# Patient Record
Sex: Female | Born: 1960 | Race: White | Hispanic: No | Smoking: Never smoker
Health system: Southern US, Community
[De-identification: ages and names within clinical notes are randomized; demographics above are authoritative.]

## PROBLEM LIST (undated history)

## (undated) ENCOUNTER — Emergency Department (HOSPITAL_COMMUNITY): Admission: EM | Payer: Medicare Other | Source: Home / Self Care

## (undated) DIAGNOSIS — R51 Headache: Secondary | ICD-10-CM

## (undated) DIAGNOSIS — F32A Depression, unspecified: Secondary | ICD-10-CM

## (undated) DIAGNOSIS — M797 Fibromyalgia: Secondary | ICD-10-CM

## (undated) DIAGNOSIS — I10 Essential (primary) hypertension: Secondary | ICD-10-CM

## (undated) DIAGNOSIS — G5600 Carpal tunnel syndrome, unspecified upper limb: Secondary | ICD-10-CM

## (undated) DIAGNOSIS — M1990 Unspecified osteoarthritis, unspecified site: Secondary | ICD-10-CM

## (undated) DIAGNOSIS — E119 Type 2 diabetes mellitus without complications: Secondary | ICD-10-CM

## (undated) DIAGNOSIS — F329 Major depressive disorder, single episode, unspecified: Secondary | ICD-10-CM

## (undated) DIAGNOSIS — F419 Anxiety disorder, unspecified: Secondary | ICD-10-CM

## (undated) HISTORY — PX: ANKLE SURGERY: SHX546

## (undated) HISTORY — PX: OTHER SURGICAL HISTORY: SHX169

---

## 2010-06-28 ENCOUNTER — Ambulatory Visit: Payer: Medicare Other | Admitting: Physical Therapy

## 2010-06-29 ENCOUNTER — Ambulatory Visit: Payer: Medicare Other | Attending: Orthopedic Surgery | Admitting: Physical Therapy

## 2010-06-29 DIAGNOSIS — M546 Pain in thoracic spine: Secondary | ICD-10-CM | POA: Insufficient documentation

## 2010-06-29 DIAGNOSIS — M545 Low back pain, unspecified: Secondary | ICD-10-CM | POA: Insufficient documentation

## 2010-06-29 DIAGNOSIS — R5381 Other malaise: Secondary | ICD-10-CM | POA: Insufficient documentation

## 2010-06-29 DIAGNOSIS — IMO0001 Reserved for inherently not codable concepts without codable children: Secondary | ICD-10-CM | POA: Insufficient documentation

## 2010-06-29 DIAGNOSIS — M542 Cervicalgia: Secondary | ICD-10-CM | POA: Insufficient documentation

## 2010-07-06 ENCOUNTER — Ambulatory Visit: Payer: Medicare Other | Admitting: Physical Therapy

## 2010-07-07 ENCOUNTER — Ambulatory Visit: Payer: Medicare Other | Admitting: Physical Therapy

## 2010-07-12 ENCOUNTER — Ambulatory Visit: Payer: Medicare Other | Attending: Orthopedic Surgery | Admitting: Physical Therapy

## 2010-07-12 DIAGNOSIS — M546 Pain in thoracic spine: Secondary | ICD-10-CM | POA: Insufficient documentation

## 2010-07-12 DIAGNOSIS — M545 Low back pain, unspecified: Secondary | ICD-10-CM | POA: Insufficient documentation

## 2010-07-12 DIAGNOSIS — M542 Cervicalgia: Secondary | ICD-10-CM | POA: Insufficient documentation

## 2010-07-12 DIAGNOSIS — R5381 Other malaise: Secondary | ICD-10-CM | POA: Insufficient documentation

## 2010-07-12 DIAGNOSIS — IMO0001 Reserved for inherently not codable concepts without codable children: Secondary | ICD-10-CM | POA: Insufficient documentation

## 2010-07-13 ENCOUNTER — Ambulatory Visit: Payer: Medicare Other | Admitting: Physical Therapy

## 2010-07-19 ENCOUNTER — Ambulatory Visit: Payer: Medicare Other | Admitting: Physical Therapy

## 2010-07-20 ENCOUNTER — Ambulatory Visit: Payer: Medicare Other | Admitting: Physical Therapy

## 2010-07-22 ENCOUNTER — Other Ambulatory Visit: Payer: Self-pay | Admitting: Physical Medicine and Rehabilitation

## 2010-07-22 DIAGNOSIS — M542 Cervicalgia: Secondary | ICD-10-CM

## 2010-07-22 DIAGNOSIS — M545 Low back pain, unspecified: Secondary | ICD-10-CM

## 2010-07-26 ENCOUNTER — Ambulatory Visit: Payer: Medicare Other | Admitting: Physical Therapy

## 2010-07-27 ENCOUNTER — Encounter: Payer: Medicare Other | Admitting: Physical Therapy

## 2010-07-29 ENCOUNTER — Ambulatory Visit
Admission: RE | Admit: 2010-07-29 | Discharge: 2010-07-29 | Disposition: A | Discharge status: Home / Self Care | Payer: Medicare Other | Source: Ambulatory Visit | Attending: Physical Medicine and Rehabilitation | Admitting: Physical Medicine and Rehabilitation

## 2010-07-29 DIAGNOSIS — M545 Low back pain, unspecified: Secondary | ICD-10-CM

## 2010-07-29 DIAGNOSIS — M542 Cervicalgia: Secondary | ICD-10-CM

## 2011-02-14 ENCOUNTER — Ambulatory Visit (HOSPITAL_BASED_OUTPATIENT_CLINIC_OR_DEPARTMENT_OTHER): Admission: RE | Admit: 2011-02-14 | Payer: Medicare Other | Source: Ambulatory Visit | Admitting: Orthopedic Surgery

## 2011-12-01 ENCOUNTER — Encounter (HOSPITAL_COMMUNITY): Payer: Self-pay | Admitting: *Deleted

## 2011-12-01 ENCOUNTER — Emergency Department (HOSPITAL_COMMUNITY)
Admission: EM | Admit: 2011-12-01 | Discharge: 2011-12-01 | Discharge status: Against Medical Advice | Payer: Medicare Other | Attending: Emergency Medicine | Admitting: Emergency Medicine

## 2011-12-01 DIAGNOSIS — R110 Nausea: Secondary | ICD-10-CM | POA: Insufficient documentation

## 2011-12-01 DIAGNOSIS — R197 Diarrhea, unspecified: Secondary | ICD-10-CM | POA: Insufficient documentation

## 2011-12-01 HISTORY — DX: Essential (primary) hypertension: I10

## 2011-12-01 HISTORY — DX: Anxiety disorder, unspecified: F41.9

## 2011-12-01 NOTE — ED Notes (Signed)
Patient relates she began having nausea after taking Effexor at about 5pm yesterday.  Began having diarrhea today at about 9 am.

## 2013-11-11 NOTE — H&P (Deleted)
Cheyenne Welch is an 53 y.o. female.   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  Symptoms unchanged from previous visit.  Wants to proceed with L4-5 TLIF.  Past Medical History  Diagnosis Date  . Cancer     R breast  . Anxiety   . Hypertension   . Aneurysm     vessel behind left eye  . Ischemic colitis   . Personal history of radiation therapy     currently receiving radiation therapy 8/35    Past Surgical History  Procedure Laterality Date  . Brain surgery      aneurysm clipping  . Breast surgery      lumpectomy R breast  . Cholecystectomy    . Appendectomy    . Abdominal hysterectomy      Family History  Problem Relation Age of Onset  . Cancer Mother   . Hypertension Mother   . Hypertension Father   . Diabetes Father   . Cancer Father   . Hypertension Brother    Social History:  reports that she has quit smoking. She does not have any smokeless tobacco history on file. She reports that she does not drink alcohol or use illicit drugs.  Allergies:  Allergies  Allergen Reactions  . Vicodin [Hydrocodone-Acetaminophen] Nausea And Vomiting    No prescriptions prior to admission    No results found for this or any previous visit (from the past 48 hour(s)). No results found.  Review of Systems  Constitutional: Negative.   HENT: Negative.   Eyes: Negative.   Respiratory: Negative.   Cardiovascular: Negative.   Gastrointestinal: Negative.   Genitourinary: Negative.   Musculoskeletal: Positive for back pain.  Skin: Negative.   Neurological: Positive for tingling. Negative for speech change, seizures and loss of consciousness.  Psychiatric/Behavioral: The patient is nervous/anxious.     There were no vitals taken for this visit. Physical Exam  Constitutional: She is oriented to person, place, and time. She appears well-developed.  HENT:  Head:  Normocephalic and atraumatic.  Eyes: EOM are normal. Pupils are equal, round, and reactive to light.  Neck: Normal range of motion.  Cardiovascular: Normal rate and regular rhythm.   Respiratory: Effort normal and breath sounds normal.  GI: Soft. Bowel sounds are normal.  Musculoskeletal: She exhibits tenderness (right ankle ttp.  ).  Neurological: She is alert and oriented to person, place, and time.  Skin: Skin is warm and dry.  Psychiatric: She has a normal mood and affect.     Assessment/Plan L4-5 severe DDD.  Low back pain and right LE radiculopathy Will proceed with L4-5 TLIF as sched.  Surgical procedure along with potential risks and complications discussed in detail.  All questions answered.    Cheyenne Welch M 11/11/2013, 9:08 AM

## 2013-11-12 NOTE — H&P (Signed)
Agree with above Plan on moving forward with surgery as planned

## 2013-11-12 NOTE — Pre-Procedure Instructions (Signed)
Allix Ouderkirk  11/12/2013   Your procedure is scheduled on:  Thursday, November 14, 2013  Report to [facility] Entrance "A" [address] at [facility] AM.  Call this number if you have problems the morning of surgery: [phone]   Remember:   Do not eat food or drink liquids after midnight.   Take these medicines the morning of surgery with A SIP OF WATER: albuterol inhaler, bring with you on the day of surgery, Xanax if needed, Wellbutrin XL, Effexor-XR   STOP taking Aspirin, Goody's, BC's, Aleve (Naproxen), Ibuprofen (Advil or Motrin), Fish Oil, Vitamins, Herbal Supplements or any substance that could thin your blood starting today, November 13, 2013.   Do not wear jewelry, make-up or nail polish.  Do not wear lotions, powders, or perfumes. You may wear deodorant.  Do not shave 48 hours prior to surgery.   Do not bring valuables to the hospital.  [facility] is not responsibe for any belongings or valuables.               Contacts, dentures or bridgework may not be worn into surgery.  Leave suitcase in the car. After surgery it may be brought to your room.  For patients admitted to the hospital, discharge time is determined by your treatment team.               Patients discharged the day of surgery will not be allowed to drive home.    Special Instructions:[place] - Preparing for Surgery  Before surgery, you can play an important role.  Because skin is not sterile, your skin needs to be as free of germs as possible.  You can reduce the number of germs on you skin by washing with CHG (chlorahexidine gluconate) soap before surgery.  CHG is an antiseptic cleaner which kills germs and bonds with the skin to continue killing germs even after washing.  Please DO NOT use if you have an allergy to CHG or antibacterial soaps.  If your skin becomes reddened/irritated stop using the CHG and inform your nurse when you arrive at Short Stay.  Do not shave (including legs  and underarms) for at least 48 hours prior to the first CHG shower.  You may shave your face.  Please follow these instructions carefully:   1.  Shower with CHG Soap the night before surgery and the                                morning of Surgery.  2.  If you choose to wash your hair, wash your hair first as usual with your  normal shampoo.  3.  After you shampoo, rinse your hair and body thoroughly to remove the Shampoo.  4.  Use CHG as you would any other liquid soap.  You can apply chg directly to the skin and wash gently with scrungie or a clean washcloth.  5.  Apply the CHG Soap to your body ONLY FROM THE NECK DOWN.   Do not use on open wounds or open sores.  Avoid contact with your eyes, ears, mouth and genitals (private parts).  Wash genitals (private parts)  with your normal soap.  6.  Wash thoroughly, paying special attention to the area where your surgery will be performed.  7.  Thoroughly rinse your body with warm water from the neck down.  8.  DO NOT shower/wash with your normal  soap after using and rinsing off the CHG Soap.  9.  Pat yourself dry with a clean towel.            10.  Wear clean pajamas.            11.  Place clean sheets on your bed the night of your first shower and do not sleep with pets.  Day of Surgery  Do not apply any lotions/deoderants the morning of surgery.  Please wear clean clothes to the hospital/surgery center.      Please read over the following fact sheets that you were given: Pain Booklet, Coughing and Deep Breathing, Blood Transfusion Information, MRSA Information and Surgical Site Infection Prevention

## 2013-11-12 NOTE — Progress Notes (Signed)
No orders, Dr. Rolena Infante office called by Danae Chen regarding this and PAT appt 11/13/13 at 1200.

## 2013-11-13 ENCOUNTER — Ambulatory Visit (HOSPITAL_COMMUNITY)
Admission: RE | Admit: 2013-11-13 | Discharge: 2013-11-13 | Disposition: A | Discharge status: Home / Self Care | Payer: Medicare Other | Source: Ambulatory Visit | Attending: Anesthesiology | Admitting: Anesthesiology

## 2013-11-13 ENCOUNTER — Encounter (HOSPITAL_COMMUNITY)
Admission: RE | Admit: 2013-11-13 | Discharge: 2013-11-13 | Disposition: A | Discharge status: Home / Self Care | Payer: Medicare Other | Source: Ambulatory Visit | Attending: Orthopedic Surgery | Admitting: Orthopedic Surgery

## 2013-11-13 ENCOUNTER — Encounter (HOSPITAL_COMMUNITY): Payer: Self-pay

## 2013-11-13 DIAGNOSIS — M48061 Spinal stenosis, lumbar region without neurogenic claudication: Secondary | ICD-10-CM | POA: Diagnosis not present

## 2013-11-13 DIAGNOSIS — M549 Dorsalgia, unspecified: Secondary | ICD-10-CM | POA: Insufficient documentation

## 2013-11-13 DIAGNOSIS — C50919 Malignant neoplasm of unspecified site of unspecified female breast: Secondary | ICD-10-CM | POA: Diagnosis not present

## 2013-11-13 DIAGNOSIS — E119 Type 2 diabetes mellitus without complications: Secondary | ICD-10-CM | POA: Diagnosis not present

## 2013-11-13 DIAGNOSIS — F411 Generalized anxiety disorder: Secondary | ICD-10-CM | POA: Diagnosis not present

## 2013-11-13 HISTORY — DX: Type 2 diabetes mellitus without complications: E11.9

## 2013-11-13 HISTORY — DX: Fibromyalgia: M79.7

## 2013-11-13 HISTORY — DX: Depression, unspecified: F32.A

## 2013-11-13 HISTORY — DX: Major depressive disorder, single episode, unspecified: F32.9

## 2013-11-13 HISTORY — DX: Headache: R51

## 2013-11-13 HISTORY — DX: Unspecified osteoarthritis, unspecified site: M19.90

## 2013-11-13 HISTORY — DX: Carpal tunnel syndrome, unspecified upper limb: G56.00

## 2013-11-13 LAB — CBC
HCT: 39.6 % (ref 36.0–46.0)
Hemoglobin: 13.2 g/dL (ref 12.0–15.0)
MCH: 30.6 pg (ref 26.0–34.0)
MCHC: 33.3 g/dL (ref 30.0–36.0)
MCV: 91.9 fL (ref 78.0–100.0)
PLATELETS: 220 10*3/uL (ref 150–400)
RBC: 4.31 MIL/uL (ref 3.87–5.11)
RDW: 13.6 % (ref 11.5–15.5)
WBC: 6.3 10*3/uL (ref 4.0–10.5)

## 2013-11-13 LAB — SURGICAL PCR SCREEN
MRSA, PCR: NEGATIVE
STAPHYLOCOCCUS AUREUS: NEGATIVE

## 2013-11-13 LAB — TYPE AND SCREEN
ABO/RH(D): A POS
Antibody Screen: NEGATIVE

## 2013-11-13 LAB — BASIC METABOLIC PANEL
Anion gap: 13 (ref 5–15)
BUN: 14 mg/dL (ref 6–23)
CALCIUM: 9.1 mg/dL (ref 8.4–10.5)
CO2: 26 mEq/L (ref 19–32)
Chloride: 100 mEq/L (ref 96–112)
Creatinine, Ser: 0.64 mg/dL (ref 0.50–1.10)
Glucose, Bld: 223 mg/dL — ABNORMAL HIGH (ref 70–99)
POTASSIUM: 4.1 meq/L (ref 3.7–5.3)
SODIUM: 139 meq/L (ref 137–147)

## 2013-11-13 LAB — ABO/RH: ABO/RH(D): A POS

## 2013-11-13 MED ORDER — ACETAMINOPHEN 10 MG/ML IV SOLN
1000.0000 mg | Freq: Four times a day (QID) | INTRAVENOUS | Status: DC
  Filled 2013-11-13: qty 100

## 2013-11-13 MED ORDER — DEXAMETHASONE SODIUM PHOSPHATE 4 MG/ML IJ SOLN
4.0000 mg | Freq: Once | INTRAMUSCULAR | Status: AC
  Administered 2013-11-14: 4 mg via INTRAVENOUS
  Filled 2013-11-13: qty 1

## 2013-11-13 NOTE — Progress Notes (Signed)
11/13/13 1252  OBSTRUCTIVE SLEEP APNEA  Have you ever been diagnosed with sleep apnea through a sleep study? No  Do you snore loudly (loud enough to be heard through closed doors)?  1  Do you often feel tired, fatigued, or sleepy during the daytime? 1  Has anyone observed you stop breathing during your sleep? 0  Do you have, or are you being treated for high blood pressure? 1  BMI more than 35 kg/m2? 1  Age over 54 years old? 1  Neck circumference greater than 40 cm/16 inches? 1  Gender: 0  Obstructive Sleep Apnea Score 6  Score 4 or greater  Results sent to PCP

## 2013-11-13 NOTE — Progress Notes (Signed)
PCP is Dr Reesa Chew Denies seeing a cardiologist. Denies having a recent CXR or EKG Denies having a stress test, card cath, or echo.

## 2013-11-14 ENCOUNTER — Encounter (HOSPITAL_COMMUNITY): Payer: Self-pay | Admitting: *Deleted

## 2013-11-14 ENCOUNTER — Encounter (HOSPITAL_COMMUNITY): Payer: Medicare Other | Admitting: Anesthesiology

## 2013-11-14 ENCOUNTER — Inpatient Hospital Stay (HOSPITAL_COMMUNITY): Payer: Medicare Other | Admitting: Anesthesiology

## 2013-11-14 ENCOUNTER — Inpatient Hospital Stay (HOSPITAL_COMMUNITY): Payer: Medicare Other

## 2013-11-14 ENCOUNTER — Inpatient Hospital Stay (HOSPITAL_COMMUNITY)
Admission: RE | Admit: 2013-11-14 | Discharge: 2013-11-19 | DRG: 460 | Disposition: A | Discharge status: Skilled Nursing Facility | Payer: Medicare Other | Source: Ambulatory Visit | Attending: Orthopedic Surgery | Admitting: Orthopedic Surgery

## 2013-11-14 ENCOUNTER — Encounter (HOSPITAL_COMMUNITY)
Admission: RE | Disposition: A | Discharge status: Skilled Nursing Facility | Payer: Medicare Other | Source: Ambulatory Visit | Attending: Orthopedic Surgery

## 2013-11-14 DIAGNOSIS — F3289 Other specified depressive episodes: Secondary | ICD-10-CM | POA: Diagnosis present

## 2013-11-14 DIAGNOSIS — M48061 Spinal stenosis, lumbar region without neurogenic claudication: Principal | ICD-10-CM | POA: Diagnosis present

## 2013-11-14 DIAGNOSIS — F329 Major depressive disorder, single episode, unspecified: Secondary | ICD-10-CM | POA: Diagnosis present

## 2013-11-14 DIAGNOSIS — M549 Dorsalgia, unspecified: Secondary | ICD-10-CM | POA: Diagnosis present

## 2013-11-14 DIAGNOSIS — IMO0001 Reserved for inherently not codable concepts without codable children: Secondary | ICD-10-CM | POA: Diagnosis present

## 2013-11-14 DIAGNOSIS — Z923 Personal history of irradiation: Secondary | ICD-10-CM

## 2013-11-14 DIAGNOSIS — F411 Generalized anxiety disorder: Secondary | ICD-10-CM | POA: Diagnosis present

## 2013-11-14 DIAGNOSIS — C50919 Malignant neoplasm of unspecified site of unspecified female breast: Secondary | ICD-10-CM | POA: Diagnosis present

## 2013-11-14 DIAGNOSIS — E119 Type 2 diabetes mellitus without complications: Secondary | ICD-10-CM | POA: Diagnosis present

## 2013-11-14 DIAGNOSIS — I10 Essential (primary) hypertension: Secondary | ICD-10-CM | POA: Diagnosis present

## 2013-11-14 DIAGNOSIS — Z87891 Personal history of nicotine dependence: Secondary | ICD-10-CM

## 2013-11-14 DIAGNOSIS — M129 Arthropathy, unspecified: Secondary | ICD-10-CM | POA: Diagnosis present

## 2013-11-14 HISTORY — PX: TRANSFORAMINAL LUMBAR INTERBODY FUSION (TLIF) WITH PEDICLE SCREW FIXATION 4 LEVEL: SHX6144

## 2013-11-14 LAB — GLUCOSE, CAPILLARY
GLUCOSE-CAPILLARY: 165 mg/dL — AB (ref 70–99)
GLUCOSE-CAPILLARY: 165 mg/dL — AB (ref 70–99)
Glucose-Capillary: 184 mg/dL — ABNORMAL HIGH (ref 70–99)
Glucose-Capillary: 222 mg/dL — ABNORMAL HIGH (ref 70–99)

## 2013-11-14 SURGERY — POSTERIOR LUMBAR FUSION 1 LEVEL
Anesthesia: General

## 2013-11-14 MED ORDER — ACETAMINOPHEN 10 MG/ML IV SOLN
1000.0000 mg | Freq: Four times a day (QID) | INTRAVENOUS | Status: AC
  Administered 2013-11-14 – 2013-11-15 (×4): 1000 mg via INTRAVENOUS
  Filled 2013-11-14 (×5): qty 100

## 2013-11-14 MED ORDER — FENTANYL 10 MCG/ML IV SOLN
INTRAVENOUS | Status: DC
  Administered 2013-11-14: 50 ug via INTRAVENOUS
  Administered 2013-11-14: 18:00:00 via INTRAVENOUS
  Administered 2013-11-15: 293.9 ug via INTRAVENOUS
  Administered 2013-11-15: 114 ug via INTRAVENOUS
  Administered 2013-11-15: 05:00:00 via INTRAVENOUS
  Filled 2013-11-14 (×3): qty 50

## 2013-11-14 MED ORDER — CEFAZOLIN SODIUM-DEXTROSE 2-3 GM-% IV SOLR
INTRAVENOUS | Status: DC | PRN
  Administered 2013-11-14: 2 g via INTRAVENOUS

## 2013-11-14 MED ORDER — ONDANSETRON HCL 4 MG/2ML IJ SOLN
4.0000 mg | INTRAMUSCULAR | Status: DC | PRN
  Administered 2013-11-14: 4 mg via INTRAVENOUS

## 2013-11-14 MED ORDER — OXYCODONE HCL 5 MG PO TABS: 5.0000 mg | ORAL_TABLET | Freq: Once | ORAL | Status: DC | PRN

## 2013-11-14 MED ORDER — BUPROPION HCL ER (XL) 300 MG PO TB24
300.0000 mg | ORAL_TABLET | Freq: Every day | ORAL | Status: DC
  Administered 2013-11-16 – 2013-11-19 (×4): 300 mg via ORAL
  Filled 2013-11-14 (×5): qty 1

## 2013-11-14 MED ORDER — MIDAZOLAM HCL 5 MG/5ML IJ SOLN
INTRAMUSCULAR | Status: DC | PRN
Start: 2013-11-14 — End: 2013-11-14
  Administered 2013-11-14: 2 mg via INTRAVENOUS

## 2013-11-14 MED ORDER — ALPRAZOLAM 0.5 MG PO TABS
0.5000 mg | ORAL_TABLET | Freq: Two times a day (BID) | ORAL | Status: DC | PRN
  Administered 2013-11-15 – 2013-11-19 (×6): 0.5 mg via ORAL
  Filled 2013-11-14 (×6): qty 1

## 2013-11-14 MED ORDER — THROMBIN 20000 UNITS EX KIT: PACK | CUTANEOUS | Status: DC | PRN

## 2013-11-14 MED ORDER — METFORMIN HCL ER 500 MG PO TB24
1000.0000 mg | ORAL_TABLET | Freq: Two times a day (BID) | ORAL | Status: DC
  Administered 2013-11-15 – 2013-11-19 (×9): 1000 mg via ORAL
  Filled 2013-11-14 (×11): qty 2

## 2013-11-14 MED ORDER — LACTATED RINGERS IV SOLN
Freq: Once | INTRAVENOUS | Status: AC
  Administered 2013-11-14: 10:00:00 via INTRAVENOUS

## 2013-11-14 MED ORDER — PROPOFOL INFUSION 10 MG/ML OPTIME
INTRAVENOUS | Status: DC | PRN
  Administered 2013-11-14: 16:00:00 via INTRAVENOUS
  Administered 2013-11-14: 50 ug/kg/min via INTRAVENOUS

## 2013-11-14 MED ORDER — LACTATED RINGERS IV SOLN
INTRAVENOUS | Status: DC
  Administered 2013-11-14: 23:00:00 via INTRAVENOUS

## 2013-11-14 MED ORDER — INSULIN ASPART 100 UNIT/ML ~~LOC~~ SOLN
0.0000 [IU] | SUBCUTANEOUS | Status: DC
  Administered 2013-11-14 – 2013-11-15 (×3): 5 [IU] via SUBCUTANEOUS
  Administered 2013-11-15: 3 [IU] via SUBCUTANEOUS
  Administered 2013-11-15: 2 [IU] via SUBCUTANEOUS
  Administered 2013-11-16: 3 [IU] via SUBCUTANEOUS
  Administered 2013-11-16: 2 [IU] via SUBCUTANEOUS
  Administered 2013-11-16: 3 [IU] via SUBCUTANEOUS
  Administered 2013-11-16: 5 [IU] via SUBCUTANEOUS
  Administered 2013-11-17 (×5): 2 [IU] via SUBCUTANEOUS
  Administered 2013-11-18: 3 [IU] via SUBCUTANEOUS
  Administered 2013-11-18: 2 [IU] via SUBCUTANEOUS

## 2013-11-14 MED ORDER — ONDANSETRON HCL 4 MG/2ML IJ SOLN: 4.0000 mg | Freq: Four times a day (QID) | INTRAMUSCULAR | Status: DC | PRN

## 2013-11-14 MED ORDER — LIDOCAINE HCL (PF) 1 % IJ SOLN
INTRAMUSCULAR | Status: AC
  Filled 2013-11-14: qty 30

## 2013-11-14 MED ORDER — NITROGLYCERIN 0.4 MG/SPRAY TL SOLN
Status: AC
  Filled 2013-11-14: qty 4.9

## 2013-11-14 MED ORDER — PROPOFOL 10 MG/ML IV BOLUS
INTRAVENOUS | Status: DC | PRN
  Administered 2013-11-14: 17 mg via INTRAVENOUS

## 2013-11-14 MED ORDER — HYDROMORPHONE HCL PF 1 MG/ML IJ SOLN
INTRAMUSCULAR | Status: AC
  Administered 2013-11-14: 0.5 mg via INTRAVENOUS
  Filled 2013-11-14: qty 1

## 2013-11-14 MED ORDER — HYDROMORPHONE HCL PF 1 MG/ML IJ SOLN
0.2500 mg | INTRAMUSCULAR | Status: DC | PRN
  Administered 2013-11-14 (×2): 0.5 mg via INTRAVENOUS

## 2013-11-14 MED ORDER — ARTIFICIAL TEARS OP OINT
TOPICAL_OINTMENT | OPHTHALMIC | Status: DC | PRN
  Administered 2013-11-14: 1 via OPHTHALMIC

## 2013-11-14 MED ORDER — PHENOL 1.4 % MT LIQD: 1.0000 | OROMUCOSAL | Status: DC | PRN

## 2013-11-14 MED ORDER — PROPOFOL 10 MG/ML IV BOLUS
INTRAVENOUS | Status: AC
  Filled 2013-11-14: qty 20

## 2013-11-14 MED ORDER — MIDAZOLAM HCL 2 MG/2ML IJ SOLN
INTRAMUSCULAR | Status: AC
  Filled 2013-11-14: qty 2

## 2013-11-14 MED ORDER — CEFAZOLIN SODIUM 1-5 GM-% IV SOLN
1.0000 g | Freq: Three times a day (TID) | INTRAVENOUS | Status: AC
  Administered 2013-11-14 – 2013-11-15 (×2): 1 g via INTRAVENOUS
  Filled 2013-11-14 (×3): qty 50

## 2013-11-14 MED ORDER — OXYCODONE HCL 5 MG/5ML PO SOLN: 5.0000 mg | Freq: Once | ORAL | Status: DC | PRN

## 2013-11-14 MED ORDER — THROMBIN 20000 UNITS EX SOLR
CUTANEOUS | Status: DC | PRN
  Administered 2013-11-14: 14:00:00 via TOPICAL

## 2013-11-14 MED ORDER — SODIUM CHLORIDE 0.9 % IJ SOLN: 9.0000 mL | INTRAMUSCULAR | Status: DC | PRN

## 2013-11-14 MED ORDER — LACTATED RINGERS IV SOLN
INTRAVENOUS | Status: DC | PRN
  Administered 2013-11-14 (×3): via INTRAVENOUS

## 2013-11-14 MED ORDER — PNEUMOCOCCAL VAC POLYVALENT 25 MCG/0.5ML IJ INJ
0.5000 mL | INJECTION | INTRAMUSCULAR | Status: DC
  Filled 2013-11-14: qty 0.5

## 2013-11-14 MED ORDER — DIPHENHYDRAMINE HCL 50 MG/ML IJ SOLN: 12.5000 mg | Freq: Four times a day (QID) | INTRAMUSCULAR | Status: DC | PRN

## 2013-11-14 MED ORDER — FENTANYL CITRATE 0.05 MG/ML IJ SOLN
INTRAMUSCULAR | Status: AC
  Filled 2013-11-14: qty 5

## 2013-11-14 MED ORDER — ACETAMINOPHEN 10 MG/ML IV SOLN
INTRAVENOUS | Status: DC | PRN
  Administered 2013-11-14: 1000 mg via INTRAVENOUS

## 2013-11-14 MED ORDER — DIPHENHYDRAMINE HCL 12.5 MG/5ML PO ELIX: 12.5000 mg | ORAL_SOLUTION | Freq: Four times a day (QID) | ORAL | Status: DC | PRN

## 2013-11-14 MED ORDER — SODIUM CHLORIDE 0.9 % IJ SOLN
3.0000 mL | INTRAMUSCULAR | Status: DC | PRN
Start: 2013-11-14 — End: 2013-11-19

## 2013-11-14 MED ORDER — ONDANSETRON HCL 4 MG/2ML IJ SOLN
4.0000 mg | Freq: Four times a day (QID) | INTRAMUSCULAR | Status: DC | PRN
Start: 2013-11-14 — End: 2013-11-15
  Filled 2013-11-14: qty 2

## 2013-11-14 MED ORDER — SODIUM CHLORIDE 0.9 % IV SOLN: 250.0000 mL | INTRAVENOUS | Status: DC

## 2013-11-14 MED ORDER — NITROGLYCERIN 0.2 MG/ML ON CALL CATH LAB
INTRAVENOUS | Status: AC
  Filled 2013-11-14: qty 1

## 2013-11-14 MED ORDER — MENTHOL 3 MG MT LOZG: 1.0000 | LOZENGE | OROMUCOSAL | Status: DC | PRN

## 2013-11-14 MED ORDER — THROMBIN 20000 UNITS EX SOLR
CUTANEOUS | Status: AC
  Filled 2013-11-14: qty 20000

## 2013-11-14 MED ORDER — LIDOCAINE HCL (CARDIAC) 20 MG/ML IV SOLN
INTRAVENOUS | Status: DC | PRN
  Administered 2013-11-14: 75 mg via INTRAVENOUS

## 2013-11-14 MED ORDER — METHOCARBAMOL 1000 MG/10ML IJ SOLN
500.0000 mg | Freq: Four times a day (QID) | INTRAVENOUS | Status: DC | PRN
  Filled 2013-11-14: qty 5

## 2013-11-14 MED ORDER — FENTANYL CITRATE 0.05 MG/ML IJ SOLN
INTRAMUSCULAR | Status: DC | PRN
  Administered 2013-11-14 (×3): 50 ug via INTRAVENOUS
  Administered 2013-11-14: 100 ug via INTRAVENOUS
  Administered 2013-11-14: 50 ug via INTRAVENOUS
  Administered 2013-11-14: 100 ug via INTRAVENOUS
  Administered 2013-11-14 (×2): 50 ug via INTRAVENOUS

## 2013-11-14 MED ORDER — LOSARTAN POTASSIUM 50 MG PO TABS
50.0000 mg | ORAL_TABLET | Freq: Every day | ORAL | Status: DC
  Administered 2013-11-16 – 2013-11-19 (×4): 50 mg via ORAL
  Filled 2013-11-14 (×5): qty 1

## 2013-11-14 MED ORDER — OXYCODONE HCL 5 MG PO TABS
10.0000 mg | ORAL_TABLET | ORAL | Status: DC | PRN
  Administered 2013-11-15 – 2013-11-19 (×19): 10 mg via ORAL
  Filled 2013-11-14 (×20): qty 2

## 2013-11-14 MED ORDER — SUCCINYLCHOLINE CHLORIDE 20 MG/ML IJ SOLN
INTRAMUSCULAR | Status: DC | PRN
  Administered 2013-11-14: 120 mg via INTRAVENOUS

## 2013-11-14 MED ORDER — SODIUM CHLORIDE 0.9 % IJ SOLN
3.0000 mL | Freq: Two times a day (BID) | INTRAMUSCULAR | Status: DC
  Administered 2013-11-15 – 2013-11-17 (×3): 3 mL via INTRAVENOUS

## 2013-11-14 MED ORDER — SODIUM CHLORIDE 0.9 % IV SOLN
10.0000 mg | INTRAVENOUS | Status: DC | PRN
  Administered 2013-11-14: 25 ug/min via INTRAVENOUS

## 2013-11-14 MED ORDER — ALBUTEROL SULFATE HFA 108 (90 BASE) MCG/ACT IN AERS: 2.0000 | INHALATION_SPRAY | Freq: Four times a day (QID) | RESPIRATORY_TRACT | Status: DC | PRN

## 2013-11-14 MED ORDER — NALOXONE HCL 0.4 MG/ML IJ SOLN: 0.4000 mg | INTRAMUSCULAR | Status: DC | PRN

## 2013-11-14 MED ORDER — VENLAFAXINE HCL ER 150 MG PO CP24
150.0000 mg | ORAL_CAPSULE | Freq: Every day | ORAL | Status: DC
  Administered 2013-11-15 – 2013-11-18 (×4): 150 mg via ORAL
  Filled 2013-11-14 (×6): qty 1

## 2013-11-14 MED ORDER — MICROFIBRILLAR COLL HEMOSTAT EX PADS
MEDICATED_PAD | CUTANEOUS | Status: DC | PRN
  Administered 2013-11-14: 1 via TOPICAL

## 2013-11-14 MED ORDER — FENTANYL CITRATE 0.05 MG/ML IJ SOLN
INTRAMUSCULAR | Status: AC
  Filled 2013-11-14: qty 2

## 2013-11-14 MED ORDER — ONDANSETRON HCL 4 MG/2ML IJ SOLN
INTRAMUSCULAR | Status: DC | PRN
  Administered 2013-11-14: 4 mg via INTRAVENOUS

## 2013-11-14 MED ORDER — BUPIVACAINE-EPINEPHRINE 0.25% -1:200000 IJ SOLN
INTRAMUSCULAR | Status: DC | PRN
  Administered 2013-11-14: 10 mL

## 2013-11-14 MED ORDER — BUPIVACAINE-EPINEPHRINE (PF) 0.25% -1:200000 IJ SOLN
INTRAMUSCULAR | Status: AC
  Filled 2013-11-14: qty 30

## 2013-11-14 MED ORDER — HEPARIN (PORCINE) IN NACL 2-0.9 UNIT/ML-% IJ SOLN
INTRAMUSCULAR | Status: AC
  Filled 2013-11-14: qty 1000

## 2013-11-14 MED ORDER — GLIPIZIDE 5 MG PO TABS
5.0000 mg | ORAL_TABLET | Freq: Two times a day (BID) | ORAL | Status: DC
  Administered 2013-11-15 – 2013-11-19 (×9): 5 mg via ORAL
  Filled 2013-11-14 (×11): qty 1

## 2013-11-14 MED ORDER — METHOCARBAMOL 500 MG PO TABS
500.0000 mg | ORAL_TABLET | Freq: Four times a day (QID) | ORAL | Status: DC | PRN
  Administered 2013-11-15 – 2013-11-19 (×11): 500 mg via ORAL
  Filled 2013-11-14 (×12): qty 1

## 2013-11-14 SURGICAL SUPPLY — 77 items
BLADE SURG ROTATE 9660 (MISCELLANEOUS) IMPLANT
BUR EGG ELITE 4.0 (BURR) IMPLANT
CAGE TLIF XL 8 LUMBAR (Cage) ×2 IMPLANT
CLIP NEUROVISION LG (CLIP) ×2 IMPLANT
CLSR STERI-STRIP ANTIMIC 1/2X4 (GAUZE/BANDAGES/DRESSINGS) ×2 IMPLANT
CORDS BIPOLAR (ELECTRODE) ×2 IMPLANT
COVER MAYO STAND STRL (DRAPES) ×4 IMPLANT
COVER SURGICAL LIGHT HANDLE (MISCELLANEOUS) ×2 IMPLANT
DRAPE C-ARM 42X72 X-RAY (DRAPES) ×2 IMPLANT
DRAPE C-ARMOR (DRAPES) ×2 IMPLANT
DRAPE ORTHO SPLIT 77X108 STRL (DRAPES) ×1
DRAPE POUCH INSTRU U-SHP 10X18 (DRAPES) ×2 IMPLANT
DRAPE SURG 17X23 STRL (DRAPES) ×2 IMPLANT
DRAPE SURG ORHT 6 SPLT 77X108 (DRAPES) ×1 IMPLANT
DRAPE U-SHAPE 47X51 STRL (DRAPES) ×2 IMPLANT
DRSG MEPILEX BORDER 4X4 (GAUZE/BANDAGES/DRESSINGS) ×2 IMPLANT
DRSG MEPILEX BORDER 4X8 (GAUZE/BANDAGES/DRESSINGS) ×2 IMPLANT
DURAPREP 26ML APPLICATOR (WOUND CARE) ×2 IMPLANT
ELECT BLADE 4.0 EZ CLEAN MEGAD (MISCELLANEOUS) ×2
ELECT BLADE 6.5 EXT (BLADE) IMPLANT
ELECT PENCIL ROCKER SW 15FT (MISCELLANEOUS) ×2 IMPLANT
ELECT REM PT RETURN 9FT ADLT (ELECTROSURGICAL) ×2
ELECTRODE BLDE 4.0 EZ CLN MEGD (MISCELLANEOUS) ×1 IMPLANT
ELECTRODE REM PT RTRN 9FT ADLT (ELECTROSURGICAL) ×1 IMPLANT
GLOVE BIOGEL PI IND STRL 8 (GLOVE) ×1 IMPLANT
GLOVE BIOGEL PI IND STRL 8.5 (GLOVE) ×1 IMPLANT
GLOVE BIOGEL PI INDICATOR 8 (GLOVE) ×1
GLOVE BIOGEL PI INDICATOR 8.5 (GLOVE) ×1
GLOVE ECLIPSE 8.5 STRL (GLOVE) ×2 IMPLANT
GLOVE ORTHO TXT STRL SZ7.5 (GLOVE) ×2 IMPLANT
GOWN STRL REUS W/ TWL LRG LVL3 (GOWN DISPOSABLE) ×1 IMPLANT
GOWN STRL REUS W/TWL 2XL LVL3 (GOWN DISPOSABLE) ×4 IMPLANT
GOWN STRL REUS W/TWL LRG LVL3 (GOWN DISPOSABLE) ×1
GUIDEWIRE NITINOL BEVEL TIP (WIRE) ×8 IMPLANT
KIT BASIN OR (CUSTOM PROCEDURE TRAY) ×2 IMPLANT
KIT NEEDLE NVM5 EMG ELECT (KITS) ×1 IMPLANT
KIT NEEDLE NVM5 EMG ELECTRODE (KITS) ×1
KIT POSITION SURG JACKSON T1 (MISCELLANEOUS) ×2 IMPLANT
KIT ROOM TURNOVER OR (KITS) ×2 IMPLANT
LIGHT SOURCE ANGLE TIP STR 7FT (MISCELLANEOUS) ×2 IMPLANT
MAS TLIF HOOP SHIM (KITS) ×2 IMPLANT
NEEDLE 22X1 1/2 (OR ONLY) (NEEDLE) ×2 IMPLANT
NEEDLE I-PASS III (NEEDLE) ×2 IMPLANT
NEEDLE SPNL 18GX3.5 QUINCKE PK (NEEDLE) ×4 IMPLANT
NS IRRIG 1000ML POUR BTL (IV SOLUTION) ×2 IMPLANT
PACK LAMINECTOMY ORTHO (CUSTOM PROCEDURE TRAY) ×2 IMPLANT
PACK UNIVERSAL I (CUSTOM PROCEDURE TRAY) ×2 IMPLANT
PAD ARMBOARD 7.5X6 YLW CONV (MISCELLANEOUS) ×4 IMPLANT
PATTIES SURGICAL .5 X.5 (GAUZE/BANDAGES/DRESSINGS) IMPLANT
PATTIES SURGICAL .5 X1 (DISPOSABLE) ×2 IMPLANT
PRECEPT SHANK 6.5X45 (Neuro Prosthesis/Implant) ×2 IMPLANT
PRECEPT SHANK CANN 6.5X40 (Neuro Prosthesis/Implant) ×2 IMPLANT
PRECEPT TULIPS (Neuro Prosthesis/Implant) ×4 IMPLANT
PROBE BALL TIP NVM5 SNG USE (BALLOONS) ×2 IMPLANT
PUTTY DBX 2.5CC (Putty) ×2 IMPLANT
PUTTY DBX 2.5CC DEPUY (Putty) ×1 IMPLANT
ROD 45MM (Rod) ×4 IMPLANT
SCREW PRECEPT 6.5X45 (Screw) ×4 IMPLANT
SCREW PRECEPT SET (Screw) ×8 IMPLANT
SHEET CONFORM 45LX20WX5H (Bone Implant) ×2 IMPLANT
SPONGE LAP 4X18 X RAY DECT (DISPOSABLE) ×4 IMPLANT
SPONGE SURGIFOAM ABS GEL 100 (HEMOSTASIS) ×2 IMPLANT
STRIP CLOSURE SKIN 1/2X4 (GAUZE/BANDAGES/DRESSINGS) ×2 IMPLANT
SURGIFLO TRUKIT (HEMOSTASIS) ×20 IMPLANT
SUT BONE WAX W31G (SUTURE) ×2 IMPLANT
SUT MON AB 3-0 SH 27 (SUTURE) ×2
SUT MON AB 3-0 SH27 (SUTURE) ×2 IMPLANT
SUT VIC AB 1 CTX 18 (SUTURE) ×2 IMPLANT
SUT VIC AB 2-0 CT1 18 (SUTURE) ×2 IMPLANT
SUT VICRYL 0 UR6 27IN ABS (SUTURE) ×2 IMPLANT
SYR BULB IRRIGATION 50ML (SYRINGE) ×2 IMPLANT
SYR CONTROL 10ML LL (SYRINGE) ×2 IMPLANT
TOWEL OR 17X24 6PK STRL BLUE (TOWEL DISPOSABLE) ×2 IMPLANT
TOWEL OR 17X26 10 PK STRL BLUE (TOWEL DISPOSABLE) ×2 IMPLANT
TRAY FOLEY CATH 16FRSI W/METER (SET/KITS/TRAYS/PACK) ×2 IMPLANT
WATER STERILE IRR 1000ML POUR (IV SOLUTION) ×2 IMPLANT
YANKAUER SUCT BULB TIP NO VENT (SUCTIONS) ×2 IMPLANT

## 2013-11-14 NOTE — Anesthesia Postprocedure Evaluation (Signed)
Anesthesia Post Note  Patient: Cheyenne Welch  Procedure(s) Performed: Procedure(s) (LRB): TLIF L4-5 (N/A)  Anesthesia type: General  Patient location: PACU  Post pain: Pain level controlled  Post assessment: Patient's Cardiovascular Status Stable  Last Vitals:  Filed Vitals:   11/14/13 1730  BP: 132/55  Pulse: 99  Temp:   Resp: 15    Post vital signs: Reviewed and stable  Level of consciousness: alert  Complications: No apparent anesthesia complications

## 2013-11-14 NOTE — H&P (Signed)
No change in clinical exam H+P reviewed  

## 2013-11-14 NOTE — Anesthesia Procedure Notes (Signed)
Procedure Name: Intubation Date/Time: 11/14/2013 12:44 PM Performed by: Eligha Bridegroom Pre-anesthesia Checklist: Emergency Drugs available, Patient identified, Timeout performed, Suction available and Patient being monitored Patient Re-evaluated:Patient Re-evaluated prior to inductionOxygen Delivery Method: Circle system utilized Preoxygenation: Pre-oxygenation with 100% oxygen Intubation Type: IV induction Ventilation: Mask ventilation without difficulty Laryngoscope Size: Mac and 4 Grade View: Grade I Tube type: Oral Tube size: 7.0 mm Number of attempts: 1 Airway Equipment and Method: Stylet Placement Confirmation: ETT inserted through vocal cords under direct vision,  breath sounds checked- equal and bilateral and positive ETCO2 Secured at: 22 cm Tube secured with: Tape Dental Injury: Teeth and Oropharynx as per pre-operative assessment

## 2013-11-14 NOTE — Anesthesia Preprocedure Evaluation (Signed)
Anesthesia Evaluation  Patient identified by MRN, date of birth, ID band Patient awake    Reviewed: Allergy & Precautions, H&P , NPO status , Patient's Chart, lab work & pertinent test results  Airway Mallampati: II  Neck ROM: full    Dental   Pulmonary          Cardiovascular hypertension,     Neuro/Psych  Headaches, Anxiety Depression  Neuromuscular disease    GI/Hepatic   Endo/Other  diabetes, Type 2Morbid obesity  Renal/GU      Musculoskeletal  (+) Arthritis -, Fibromyalgia -  Abdominal   Peds  Hematology   Anesthesia Other Findings   Reproductive/Obstetrics                           Anesthesia Physical Anesthesia Plan  ASA: II  Anesthesia Plan: General   Post-op Pain Management:    Induction: Intravenous  Airway Management Planned: Oral ETT  Additional Equipment:   Intra-op Plan:   Post-operative Plan: Extubation in OR  Informed Consent: I have reviewed the patients History and Physical, chart, labs and discussed the procedure including the risks, benefits and alternatives for the proposed anesthesia with the patient or authorized representative who has indicated his/her understanding and acceptance.     Plan Discussed with: CRNA, Anesthesiologist and Surgeon  Anesthesia Plan Comments:         Anesthesia Quick Evaluation

## 2013-11-14 NOTE — Transfer of Care (Signed)
Immediate Anesthesia Transfer of Care Note  Patient: Cheyenne Welch  Procedure(s) Performed: Procedure(s): TLIF L4-5 (N/A)  Patient Location: PACU  Anesthesia Type:General  Level of Consciousness: awake, alert  and oriented  Airway & Oxygen Therapy: Patient Spontanous Breathing and Patient connected to nasal cannula oxygen  Post-op Assessment: Report given to PACU RN, Post -op Vital signs reviewed and stable and Patient moving all extremities X 4  Post vital signs: Reviewed and stable  Complications: No apparent anesthesia complications

## 2013-11-14 NOTE — Brief Op Note (Signed)
-  11/14/2013  4:10 PM  PATIENT:  Cheyenne Welch  53 y.o. 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

## 2013-11-15 ENCOUNTER — Inpatient Hospital Stay (HOSPITAL_COMMUNITY): Payer: Medicare Other

## 2013-11-15 ENCOUNTER — Encounter (HOSPITAL_COMMUNITY): Payer: Self-pay | Admitting: General Practice

## 2013-11-15 DIAGNOSIS — M48061 Spinal stenosis, lumbar region without neurogenic claudication: Secondary | ICD-10-CM | POA: Diagnosis not present

## 2013-11-15 DIAGNOSIS — M549 Dorsalgia, unspecified: Secondary | ICD-10-CM | POA: Diagnosis not present

## 2013-11-15 LAB — GLUCOSE, CAPILLARY
GLUCOSE-CAPILLARY: 184 mg/dL — AB (ref 70–99)
GLUCOSE-CAPILLARY: 137 mg/dL — AB (ref 70–99)
GLUCOSE-CAPILLARY: 210 mg/dL — AB (ref 70–99)
Glucose-Capillary: 108 mg/dL — ABNORMAL HIGH (ref 70–99)
Glucose-Capillary: 148 mg/dL — ABNORMAL HIGH (ref 70–99)
Glucose-Capillary: 208 mg/dL — ABNORMAL HIGH (ref 70–99)
Glucose-Capillary: 224 mg/dL — ABNORMAL HIGH (ref 70–99)

## 2013-11-15 LAB — HEMOGLOBIN A1C
HEMOGLOBIN A1C: 8 % — AB (ref <5.7)
MEAN PLASMA GLUCOSE: 183 mg/dL — AB (ref <117)

## 2013-11-15 MED ORDER — DOCUSATE SODIUM 100 MG PO CAPS: 100.0000 mg | ORAL_CAPSULE | Freq: Two times a day (BID) | ORAL | Status: AC

## 2013-11-15 MED ORDER — OXYCODONE-ACETAMINOPHEN 10-325 MG PO TABS: 1.0000 | ORAL_TABLET | Freq: Four times a day (QID) | ORAL | Status: DC | PRN

## 2013-11-15 MED ORDER — METHOCARBAMOL 500 MG PO TABS: 500.0000 mg | ORAL_TABLET | Freq: Four times a day (QID) | ORAL | Status: AC | PRN

## 2013-11-15 MED ORDER — POLYETHYLENE GLYCOL 3350 17 G PO PACK: 17.0000 g | PACK | Freq: Every day | ORAL | Status: AC

## 2013-11-15 MED ORDER — ONDANSETRON 4 MG PO TBDP: 4.0000 mg | ORAL_TABLET | Freq: Three times a day (TID) | ORAL | Status: AC | PRN

## 2013-11-15 MED FILL — Sodium Chloride IV Soln 0.9%: INTRAVENOUS | Qty: 1000 | Status: AC

## 2013-11-15 MED FILL — Heparin Sodium (Porcine) Inj 1000 Unit/ML: INTRAMUSCULAR | Qty: 30 | Status: AC

## 2013-11-15 NOTE — Plan of Care (Signed)
Problem: Consults Goal: Diagnosis - Spinal Surgery Outcome: Completed/Met Date Met:  11/15/13 Thoraco/Lumbar Spine Fusion L4-5

## 2013-11-15 NOTE — Evaluation (Signed)
Physical Therapy Evaluation Patient Details Name: Cheyenne Welch MRN: [id number] DOB: Jan 21, 1961 Today's Date: 11/15/2013   History of Present Illness  Cheyenne Welch is a 53 y.o. Female s/p PLIF 1 level. PMH of anxiety and HTN.   Clinical Impression  Pt anxious and needs encouragement for attempting to amb.  Pt attempts conversation and humor to delay mobility.  Pt indicates wanting rehab closer to home in [facility].  Will continue to follow.      Follow Up Recommendations SNF    Equipment Recommendations   (TBD)    Recommendations for Other Services       Precautions / Restrictions Precautions Precautions: Back;Fall Precaution Booklet Issued: Yes (comment) Precaution Comments: Educated pt on 3/3 back precautions and incorporating into ADLs.  Required Braces or Orthoses: Spinal Brace Spinal Brace: Lumbar corset;Applied in sitting position Restrictions Weight Bearing Restrictions: No      Mobility  Bed Mobility Overal bed mobility: Needs Assistance Bed Mobility: Rolling;Sidelying to Sit;Sit to Sidelying Rolling: Supervision Sidelying to sit: Min assist     Sit to sidelying: Mod assist General bed mobility comments: cues to prevent twisting and for log roll technique.  A with bringing trunk up to sitting and A with bringing Bil LEs in to bed.    Transfers Overall transfer level: Needs assistance Equipment used: Rolling walker (2 wheeled) Transfers: Sit to/from Stand Sit to Stand: Mod assist         General transfer comment: cues for UE sue and encouragement.  pt continues to be anxious.    Ambulation/Gait Ambulation/Gait assistance: Min assist Ambulation Distance (Feet): 10 Feet (x2) Assistive device: Rolling walker (2 wheeled) Gait Pattern/deviations: Step-through pattern;Decreased stride length;Trunk flexed     General Gait Details: pt relies heavily on RW.  Encouragement for amb.    Stairs            Wheelchair Mobility    Modified  Rankin (Stroke Patients Only)       Balance                                             Pertinent Vitals/Pain 10/10 low back.  Premedicated.      Home Living Family/patient expects to be discharged to:: Skilled nursing facility Living Arrangements: Spouse/significant other;Other (Comment) (pt spends time between boyfriend and sister's homes) Available Help at Discharge: Family;Available 24 hours/day (pt does not feel sister can provide level of care needed)                  Prior Function Level of Independence: Independent         Comments: pt reports she was independent, however had difficulty with donning socks/shoes     Hand Dominance   Dominant Hand: Right    Extremity/Trunk Assessment   Upper Extremity Assessment: Defer to OT evaluation           Lower Extremity Assessment: Generalized weakness      Cervical / Trunk Assessment: Normal  Communication   Communication: No difficulties  Cognition Arousal/Alertness: Awake/alert Behavior During Therapy: Anxious Overall Cognitive Status: Within Functional Limits for tasks assessed                      General Comments      Exercises        Assessment/Plan    PT Assessment Patient needs continued PT  services  PT Diagnosis Difficulty walking;Generalized weakness   PT Problem List Decreased strength;Decreased activity tolerance;Decreased mobility;Decreased balance;Decreased coordination;Decreased knowledge of use of DME;Decreased knowledge of precautions;Pain  PT Treatment Interventions DME instruction;Gait training;Functional mobility training;Therapeutic activities;Therapeutic exercise;Balance training;Patient/family education   PT Goals (Current goals can be found in the Care Plan section) Acute Rehab PT Goals Patient Stated Goal: to go to rehab and get stronger PT Goal Formulation: With patient Time For Goal Achievement: 11/29/13 Potential to Achieve Goals: Good     Frequency Min 5X/week   Barriers to discharge        Co-evaluation               End of Session Equipment Utilized During Treatment: Gait belt;Back brace Activity Tolerance: Patient limited by pain (Limited by anxiety) Patient left: in bed;with call bell/phone within reach Nurse Communication: Mobility status         Time: 1126-1209 PT Time Calculation (min): 43 min   Charges:   PT Evaluation $Initial PT Evaluation Tier I: 1 Procedure PT Treatments $Gait Training: 8-22 mins $Therapeutic Activity: 23-37 mins   PT G CodesSunny Welch, [place] [phone] 11/15/2013, 3:12 PM

## 2013-11-15 NOTE — Progress Notes (Signed)
Fentanyl PCA d/c'd. 38 mL fentanyl wasted in sink as witnessed by Colonel Bald, RN.

## 2013-11-15 NOTE — Progress Notes (Signed)
Utilization review completed.  

## 2013-11-15 NOTE — Progress Notes (Signed)
    Subjective: Procedure(s) (LRB): TLIF L4-5 (N/A) 1 Day Post-Op  Patient reports pain as 4 on 0-10 scale.  Reports decreased leg pain reports incisional back pain   Positive void Negative bowel movement Positive flatus Negative chest pain or shortness of breath  Objective: Vital signs in last 24 hours: Temp:  [97.7 F (36.5 C)-98 F (36.7 C)] 97.8 F (36.6 C) (07/10 0418) Pulse Rate:  [80-114] 89 (07/10 0418) Resp:  [10-21] 17 (07/10 0430) BP: (97-176)/(47-99) 97/47 mmHg (07/10 0418) SpO2:  [94 %-100 %] 99 % (07/10 0430) Weight:  [244 lb 8 oz (110.904 kg)] 244 lb 8 oz (110.904 kg) (07/09 0943)  Intake/Output from previous day: 07/09 0701 - 07/10 0700 In: 2200 [I.V.:2200] Out: 1560 [Urine:1410; Blood:150]  Labs:  Recent Labs  11/13/13 1332  WBC 6.3  RBC 4.31  HCT 39.6  PLT 220    Recent Labs  11/13/13 1332  NA 139  K 4.1  CL 100  CO2 26  BUN 14  CREATININE 0.64  GLUCOSE 223*  CALCIUM 9.1   No results found for this basename: LABPT, INR,  in the last 72 hours  Physical Exam: Neurologically intact ABD soft Neurovascular intact Incision: dressing C/D/I Compartment soft  Assessment/Plan: Patient stable  xrays pending Continue mobilization with physical therapy Continue care  Advance diet Up with therapy Discharge to SNF SW to see patient  Melina Schools, MD [place] [phone]

## 2013-11-15 NOTE — Op Note (Signed)
NAME:  Cheyenne Welch, Cheyenne Welch NO.:  [id number]  MEDICAL RECORD NO.:  [id number]  LOCATION:                                 FACILITY:  PHYSICIAN:  Alvy Beal, MD    DATE OF BIRTH:  07/29/1960  DATE OF PROCEDURE:  11/14/2013 DATE OF DISCHARGE:                              OPERATIVE REPORT   PREOPERATIVE DIAGNOSIS:  Degenerative lumbar disk disease with radicular leg pain, right side.  POSTOPERATIVE DIAGNOSIS:  Degenerative lumbar disk disease with radicular leg pain, right side.  OPERATIVE PROCEDURE: 1. Right-sided Gill decompression, L4-5. 2. Complete diskectomy, L4-5 with implantation of biomechanical     intervertebral device. 3. Segmental instrumentation, L4-5 with pedicle screw fixation. 4. Posterolateral arthrodesis, L4-5.  COMPLICATIONS:  None.  FIRST ASSISTANT:  Genene Churn. Barry Dienes, PA-C  HISTORY:  This is a very pleasant 53 year old woman, who has been complaining of severe back, buttock, and leg pain.  Attempts at conservative management had failed to alleviate her symptoms.  As a result of her ongoing severe pain, we elected to proceed with surgery. All appropriate risks, benefits, and alternatives to surgery were discussed with the patient and consent was obtained.  OPERATIVE NOTE:  The patient was brought to the operating room, placed supine on the operating table.  After successful induction of general anesthesia and endotracheal intubation, TEDs, SCDs, and a Foley were inserted.  She was turned prone onto the Wilson frame and all bony prominences were well padded.  The back was prepped and draped in a standard fashion.  A time-out was taken to confirm the patient procedure and all other pertinent important data.  Once this was completed, I used x-ray in the AP fluoro view to identify the lateral borders of the left 4 and 5 pedicle.  I made a longitudinal incision centered over this and passed my Jamshidi needle percutaneously to the junction of  the facet and the transverse process.  Once I confirmed trajectory in position in the AP and lateral planes, I connected the Jamshidi needle to the neuromonitoring device and advanced the Jamshidi needle to the appropriate depth.  At no point was there any electrodiagnostic evidence that there was breech and radiographically the trajectory was satisfactory.  I then advanced the Jamshidi needle into the posterior vertebral body.  I then cannulated the pedicle and removed the Jamshidi needle.  I repeated this at L5 on the left side. Once both pedicle screws were cannulated, I then tapped and then placed a 45 mm 6.5 diameter Nuvasive MIS pedicle screws to these 2 screw holes. I then stimulated the screws directly and again there was no evidence electrodiagnostically of pedicle screw breech  and nerve root compression.  Final AP and lateral fluoro views were satisfactory as well.  At this point, I went to the contralateral side on the right side now, made the incision 2 fingerbreadths from the midline.  Sharp dissection was carried out down to the deep fascia.  Deep fascia was sharply incised and I then mobilized the paraspinal muscles.  Once I had the paraspinal muscles mobilized, I placed the Jamshidi needle and using the same technique that I had used on the contralateral side, I cannulated  the 4 and 5 pedicles.  I then tapped and placed the pedicle screws, which were attached to the retracting blades.  Once the both screws were in place, I then connected the retractors to the retractor up and connected it to the toes.  I could now clearly visualize the L4-5 intervertebral space. I again stimulated both screws and there was no evidence of electrodiagnostic abnormality consistent with a breech.  With the instrumentation complete, I then proceeded with the decompression.  An osteotome was used to remove the inferior L4 facet in its entirety on that right side.  I removed this and then  released the ligamentum flavum from the leading edge of the L5 lamina.  Using a 2 mm Kerrison, I trimmed down the superior edge of the L5 lamina and this allowed me to get a nerve hook, between the thecal sac and ligamentum flavum.  I then released the ligamentum flavum and performed a generous laminotomy of L4 at the same time.  I could now visualize the posterolateral aspect of the thecal sac.  I then continued my decompression into the lateral recess.  I identified the L5 nerve root and traced it into the foramen.  I removed all of the overhanging osteophyte from the remaining superior L5 facet.  I then removed the pars in its entirety and exposed the L4 nerve root in the foramen.  Once I had this decompressed, I could now palpate and visualize the inferior and medial aspect of the L4 pedicle and the superior medial aspect of the L5 pedicle.  I could also clearly visualize the thecal sac.  I protected the thecal sac and incised the annulus with a 15 blade scalpel.  Using a combination of pituitary rongeurs and various curettes and Kerrison rongeurs, I resected all of the disk material.  I then used my side-cutting curettes to reach all the way to the contralateral side, and scrap out any degenerated disk and to ensure I had bleeding subchondral bone.  Once this process was complete, I then measured with trial devices and elected to use the size 8 Titan titanium extra long intervertebral banana cage.  The cage was packed with the local bone that was harvested from the decompression along with DBX.  I placed a sheet of CONFORM allograft sheet that had been soaking in the patient's bleb along the anterior annulus.  I then malleted the implant into the disk space to the appropriate depth.  Once it was done, I could then clearly visualize the 4 and 5 nerve roots.  The implant was placed in the anterior third of the disk space in a horizontal fashion.  There was no leak of fluid.  The  nerves were functioning fine and there was no abnormal EMG signals.  I then detached the retracting blades from the screws and then placed the polyaxial heads onto the screw.  I then irrigated the wound copiously with normal saline and made sure I had hemostasis using bipolar electrocautery.  A thrombin-soaked Gelfoam patty was placed over the exposed thecal sac.  Prior to making sure after hemostasis obtained, I did use my Woodson elevator to make sure that the lateral gutter of the L5 foramen and the superior were all decompressed.  There was no residual compression performed, 5 nerve roots were under no tension.  I could easily get my [facility] elevator superiorly, medially and inferiorly.  Thrombin-soaked Gelfoam patty was placed over the exposed thecal sac and a 45 mm rod was then secured into  the pedicle screw head and then locking caps were applied.  The caps were then torqued according to the manufacturer's standards.  This wound was irrigated copiously with normal saline.  The deep fascia was closed with interrupted #1 Vicryl sutures, superficial with 2-0 Vicryl sutures, and 3-0 Monocryl for the skin.  Steri-Strips and dry dressing were then applied.  On the left-hand side, I then went back to the left-hand side and placed bone graft in the posterolateral gutter after decorticating the facet complex with the curette.  I then obtained the same size rod, passed it subfascially and then applied the locking nuts.  The rod was secured into place and the locking nuts again were torqued according to the manufacturer's standards.  At this point, final x-rays were satisfactory.  The hardware and graft were in good position. There was no adverse intraoperative events.  The first assistant was Zonia Kief, my PA-C.  He was instrumental in assisting with positioning, retraction, visualization, and wound closure.  Again at the end of the case, all needle counts and sponge counts were correct.   There was no adverse intraoperative events, the left side 2 wounds were closed in a similar fashion.  Steri-Strips and dry dressings were applied.  She was extubated and transferred to the PACU without incident.     Alvy Beal, MD     DDB/MEDQ  D:  11/14/2013  T:  11/15/2013  Job:  [id number]

## 2013-11-15 NOTE — Clinical Documentation Improvement (Signed)
Please clarify underlying diagnosis associated with BMI=43.98 (HT 5'2.5"/WT=244lbs)   Possible Clinical conditions  Morbid Obesity W/ BMI  Underweight w/BMI  Other condition___________________  Cannot clinically determine _____________   Best Practice Alert:    Please identify the underlying diagnosis responsible for an elevated BMI and document both the diagnosis and BMI in the medical record.   Risk Factors: Sign & Symptoms: HT= 5' 2.5" (1.588 m) WT= 244 lb 8 oz (110.904 kg)  BMI=43.98 kg/m2  Diagnostics: Treatment  Thank You, Heloise Beecham ,RN Clinical Documentation Specialist:  [facility] [employer] [facility]

## 2013-11-15 NOTE — Care Management Note (Signed)
CARE MANAGEMENT NOTE 11/15/2013  Patient:  Cheyenne Welch, Cheyenne Welch   Account Number:  [id number]  Date Initiated:  11/15/2013  Documentation initiated by:  Ricki Miller  Subjective/Objective Assessment:   53 yr old female s/p L4-5 TLIF     Action/Plan:   Patient will require shortterm rehab at [facility]. Social worker is aware. Patient wants somewhere near [facility].   Anticipated DC Date:  11/18/2013   Anticipated DC Plan:  SKILLED NURSING FACILITY  In-house referral  Clinical Social Worker      DC Planning Services  CM consult      [facility] Choice  NA   Choice offered to / List presented to:     DME arranged  NA        [facility] arranged  NA      Status of service:  Completed, signed off Medicare Important Message given?   (If response is "NO", the following Medicare IM given date fields will be blank) Date Medicare IM given:   Medicare IM given by:   Date Additional Medicare IM given:   Additional Medicare IM given by:    Discharge Disposition:  [place]  Per UR Regulation:  Reviewed for med. necessity/level of care/duration of stay

## 2013-11-15 NOTE — Progress Notes (Signed)
Occupational Therapy Evaluation Patient Details Name: Cheyenne Welch MRN: [id number] DOB: 1961/02/15 Today's Date: 11/15/2013    History of Present Illness Cheyenne Welch is a 53 y.o. Female s/p PLIF 1 level. PMH of anxiety and HTN.    Clinical Impression   PTA pt lived at home and was independent with ADLs and functional mobility, however reports difficulty with LB dressing due to pain. Pt is limited by pain and anxiety, especially with transfers, however reports that she is motivated. Pt would benefit from skilled OT to increase independence with ADLs, including functional mobility, AE training, and education on maintaining back precautions. Pt would also benefit from SNF for ST rehab prior to d/c home.     Follow Up Recommendations  SNF;Supervision/Assistance - 24 hour    Equipment Recommendations  Other (comment) (Defer to SNF)       Precautions / Restrictions Precautions Precautions: Back;Fall Precaution Booklet Issued: Yes (comment) Precaution Comments: Educated pt on 3/3 back precautions and incorporating into ADLs.  Required Braces or Orthoses: Spinal Brace Spinal Brace: Lumbar corset;Applied in sitting position Restrictions Weight Bearing Restrictions: No      Mobility Bed Mobility Overal bed mobility: Needs Assistance Bed Mobility: Rolling;Sidelying to Sit;Sit to Sidelying Rolling: Min assist Sidelying to sit: Min assist     Sit to sidelying: Min assist General bed mobility comments: Pt required VC's for sequencing with log roll technique and max encouragement. Pt required assist for truncal support off bed and Bil LEs onto bed.   Transfers Overall transfer level: Needs assistance Equipment used: 2 person hand held assist Transfers: Sit to/from Stand Sit to Stand: Mod assist;+2 physical assistance         General transfer comment: Pt with high level of anxiety with sit<>stand.          ADL Overall ADL's : Needs assistance/impaired Eating/Feeding:  Independent;Sitting   Grooming: Set up;Sitting   Upper Body Bathing: Set up;Sitting   Lower Body Bathing: Sit to/from stand;Moderate assistance   Upper Body Dressing : Minimal assistance;Sitting (including back brace)   Lower Body Dressing: Maximal assistance;Sit to/from stand   Toilet Transfer: Moderate assistance;+2 for safety/equipment;Stand-pivot;BSC   Toileting- Clothing Manipulation and Hygiene: Maximal assistance;Sit to/from stand   Tub/ Engineer, structural: Moderate assistance   Functional mobility during ADLs: Moderate assistance;+2 for safety/equipment (2 person hand held assist; pt took 2 steps) General ADL Comments: Pt with high level of anxiety with mobility, especially with sit<>stand transfers. Max encouragement required to get pt to mobilize. Highly limited by pain "10/10" at start of session despite being pre-medicated. Educated pt on fall prevention and energy conservation for safety with mobility and ADLs. Educated pt on incorporating back precautions into ADLs and discussed AE to assist with LB ADLs (to be demo'd at a later date).     Vision  Pt wears glasses (bifocals) at all times.  Pt reports no change from baseline.  No apparent visual deficits.                Perception Perception Perception Tested?: No   Praxis Praxis Praxis tested?: Within functional limits    Pertinent Vitals/Pain Pt reports 10/10 pain at beginning of session with RN in room. RN relayed that pt had pain medication earlier and pt reports she has not had relief with pain meds or use of PCA. Pt repositioned in bed following OT session for max comfort and RN will continue to address pain level.      Hand Dominance Right   Extremity/Trunk Assessment  Upper Extremity Assessment Upper Extremity Assessment: Generalized weakness   Lower Extremity Assessment Lower Extremity Assessment: Defer to PT evaluation   Cervical / Trunk Assessment Cervical / Trunk Assessment: Normal    Communication Communication Communication: No difficulties   Cognition Arousal/Alertness: Awake/alert Behavior During Therapy: Anxious Overall Cognitive Status: Within Functional Limits for tasks assessed                                Home Living Family/patient expects to be discharged to:: Skilled nursing facility Living Arrangements: Spouse/significant other;Other (Comment) (pt spends time between boyfriend and sister's homes) Available Help at Discharge: Family;Available 24 hours/day (pt does not feel sister can provide level of care needed)                                    Prior Functioning/Environment Level of Independence: Independent        Comments: pt reports she was independent, however had difficulty with donning socks/shoes    OT Diagnosis: Generalized weakness;Acute pain   OT Problem List: Decreased strength;Decreased range of motion;Decreased activity tolerance;Impaired balance (sitting and/or standing);Decreased safety awareness;Decreased knowledge of use of DME or AE;Decreased knowledge of precautions;Pain   OT Treatment/Interventions: Self-care/ADL training;Therapeutic exercise;Energy conservation;DME and/or AE instruction;Therapeutic activities;Patient/family education;Balance training    OT Goals(Current goals can be found in the care plan section) Acute Rehab OT Goals Patient Stated Goal: to go to rehab and get stronger OT Goal Formulation: With patient Time For Goal Achievement: 11/22/13 Potential to Achieve Goals: Good ADL Goals Pt Will Perform Grooming: with modified independence;standing Pt Will Perform Lower Body Bathing: with modified independence;with adaptive equipment;sit to/from stand Pt Will Perform Lower Body Dressing: with modified independence;with adaptive equipment;sit to/from stand Pt Will Transfer to Toilet: with modified independence;ambulating;bedside commode Pt Will Perform Toileting - Clothing Manipulation  and hygiene: with modified independence;with adaptive equipment;sit to/from stand Additional ADL Goal #1: Pt will verbalize 3/3 back precautions independently. Additional ADL Goal #2: Pt will perform bed mobility using log roll technique with Supervision to prepare for ADLs.   OT Frequency: Min 2X/week   Barriers to D/C: Decreased caregiver support  Pt's sister is available to help, however likely cannot provide level of care needed by pt.           End of Session Equipment Utilized During Treatment: Gait belt;Back brace Nurse Communication: Mobility status;Patient requests pain meds  Activity Tolerance: Patient limited by pain;Other (comment) (limited by anxiety) Patient left: in bed;with call bell/phone within reach;Other (comment) (awaiting transport for x-ray)   Time: 2956-2130 OT Time Calculation (min): 42 min Charges:  OT General Charges $OT Visit: 1 Procedure OT Evaluation $Initial OT Evaluation Tier I: 1 Procedure OT Treatments $Self Care/Home Management : 23-37 mins  Rae Lips [phone] 11/15/2013, 9:32 AM

## 2013-11-16 LAB — GLUCOSE, CAPILLARY
GLUCOSE-CAPILLARY: 182 mg/dL — AB (ref 70–99)
Glucose-Capillary: 119 mg/dL — ABNORMAL HIGH (ref 70–99)
Glucose-Capillary: 175 mg/dL — ABNORMAL HIGH (ref 70–99)
Glucose-Capillary: 129 mg/dL — ABNORMAL HIGH (ref 70–99)

## 2013-11-16 NOTE — Progress Notes (Signed)
    Subjective: Procedure(s) (LRB): TLIF L4-5 (N/A) 2 Days Post-Op  Patient reports pain as 5 on 0-10 scale.  Reports unchanged leg pain reports incisional back pain   Positive void Negative bowel movement Positive flatus Negative chest pain or shortness of breath  Objective: Vital signs in last 24 hours: Temp:  [97.7 F (36.5 C)-98.6 F (37 C)] 98.4 F (36.9 C) (07/11 0538) Pulse Rate:  [101-106] 105 (07/11 0538) Resp:  [18-20] 18 (07/11 0538) BP: (101-125)/(52-67) 125/62 mmHg (07/11 0538) SpO2:  [92 %-98 %] 96 % (07/11 0538)  Intake/Output from previous day: 07/10 0701 - 07/11 0700 In: 720 [P.O.:720] Out: 200 [Urine:200]  Labs:  Recent Labs  11/13/13 1332  WBC 6.3  RBC 4.31  HCT 39.6  PLT 220    Recent Labs  11/13/13 1332  NA 139  K 4.1  CL 100  CO2 26  BUN 14  CREATININE 0.64  GLUCOSE 223*  CALCIUM 9.1   No results found for this basename: LABPT, INR,  in the last 72 hours  Physical Exam: Neurologically intact ABD soft Incision: dressing C/D/I Compartment soft EHL/TA/GA - 5/5 bilaterally Sensation to LT intact throughout LE bilaterally No incontinence of B/B    Assessment/Plan: Patient stable  xrays satisfactory Continue mobilization with physical therapy Continue care  Advance diet Up with therapy Poor mobilization due to right lower back pain.\ Pain is common after surgery. No focal neurologic deficits Must encourage mobilization today with PT  Melina Schools, MD [place] [phone]

## 2013-11-16 NOTE — Progress Notes (Signed)
Physical Therapy Treatment Patient Details Name: Cheyenne Welch MRN: [id number] DOB: February 26, 1961 Today's Date: 02-Dec-2013    History of Present Illness Cheyenne Welch is a 53 y.o. Female s/p PLIF 1 level. PMH of anxiety and HTN.     PT Comments    Patient reports she got up to the bathroom by herself this morning, and now has pain level of "100."  Patient with recent medication dose, attempted therapeutic exercises for ROM and pain reduction, and bed mobility for pain control with patient without significant decrease.  Patient unwilling/unable to sit up at bedside or perform other than bed exercises with therapy today due to pain.   Follow Up Recommendations  SNF     Equipment Recommendations  Other (comment) (TBD)    Recommendations for Other Services       Precautions / Restrictions Precautions Precautions: Back;Fall Precaution Booklet Issued: Yes (comment) Precaution Comments: Educated pt on 3/3 back precautions and incorporating into ADLs.  Required Braces or Orthoses: Spinal Brace Spinal Brace: Lumbar corset;Applied in supine position Restrictions Weight Bearing Restrictions: No    Mobility  Bed Mobility Overal bed mobility: Needs Assistance Bed Mobility: Rolling Rolling: Mod assist         General bed mobility comments: Limited by pain, restless legs  Transfers Overall transfer level:  (Unwilling/unable beyond bed mobility today due to pain.)                  Ambulation/Gait                 Stairs            Wheelchair Mobility    Modified Rankin (Stroke Patients Only)       Balance                                    Cognition Arousal/Alertness: Awake/alert Behavior During Therapy: Anxious;Restless Overall Cognitive Status: Within Functional Limits for tasks assessed                      Exercises General Exercises - Lower Extremity Ankle Circles/Pumps: AROM;Both Heel Slides: AAROM;Both;15  reps;Supine Hip ABduction/ADduction: AAROM;Both;15 reps;Supine    General Comments        Pertinent Vitals/Pain Pain level "100" per patient.    Home Living                      Prior Function            PT Goals (current goals can now be found in the care plan section) Acute Rehab PT Goals Patient Stated Goal: to go to rehab and get stronger    Frequency  Min 5X/week    PT Plan      Co-evaluation             End of Session Equipment Utilized During Treatment: Back brace Activity Tolerance: Patient limited by pain Patient left: in bed;with call bell/phone within reach     Time: 1155-1225 PT Time Calculation (min): 30 min  Charges:  $Therapeutic Exercise: 8-22 mins $Therapeutic Activity: 8-22 mins                    G Codes:      Cheyenne Welch 12/02/2013, 12:31 PM

## 2013-11-17 LAB — GLUCOSE, CAPILLARY
Glucose-Capillary: 111 mg/dL — ABNORMAL HIGH (ref 70–99)
Glucose-Capillary: 124 mg/dL — ABNORMAL HIGH (ref 70–99)
Glucose-Capillary: 125 mg/dL — ABNORMAL HIGH (ref 70–99)
Glucose-Capillary: 128 mg/dL — ABNORMAL HIGH (ref 70–99)
Glucose-Capillary: 150 mg/dL — ABNORMAL HIGH (ref 70–99)

## 2013-11-17 NOTE — Progress Notes (Signed)
Clinical Social Work Department BRIEF PSYCHOSOCIAL ASSESSMENT 11/17/2013  Patient:  MARSHELLE, BILGER     Account Number:  [id number]     Admit date:  11/14/2013  Clinical Social Worker:  Rolinda Roan  Date/Time:  11/17/2013 05:43 PM  Referred by:  Physician  Date Referred:  11/15/2013 Referred for  SNF Placement   Other Referral:   Interview type:  Patient Other interview type:    PSYCHOSOCIAL DATA Living Status:  ALONE Admitted from facility:   Level of care:   Primary support name:  Ivin Booty Primary support relationship to patient:  SIBLING Degree of support available:   limited support per patient.    CURRENT CONCERNS  Other Concerns:    SOCIAL WORK ASSESSMENT / PLAN Clinical Social Worker (CSW) met with patient to discuss SNF placement. Patient reported that she lives alone in [place]. Patient is agreeable to SNF search in [place] and [place].   Assessment/plan status:  Psychosocial Support/Ongoing Assessment of Needs Other assessment/ plan:   Information/referral to community resources:   CSW gave patient SNF list.    PATIENT'S/FAMILY'S RESPONSE TO PLAN OF CARE: Patient thanked CSW for visit and assisting with placement process.

## 2013-11-17 NOTE — Progress Notes (Addendum)
Clinical Social Work Department CLINICAL SOCIAL WORK PLACEMENT NOTE 11/17/2013  Patient:  Cheyenne Welch, Cheyenne Welch  Account Number:  [id number] Admit date:  11/14/2013  Clinical Social Worker:  Blima Rich, Latanya Presser  Date/time:  11/17/2013 05:48 PM  Clinical Social Work is seeking post-discharge placement for this patient at the following level of care:   [place]   (*CSW will update this form in Epic as items are completed)   11/17/2013  Patient/family provided with [facility] Department of Clinical Social Work's list of facilities offering this level of care within the geographic area requested by the patient (or if unable, by the patient's family).  11/17/2013  Patient/family informed of their freedom to choose among providers that offer the needed level of care, that participate in Medicare, Medicaid or managed care program needed by the patient, have an available bed and are willing to accept the patient.  11/17/2013  Patient/family informed of [facility]' ownership interest in [facility], as well as of the fact that they are under no obligation to receive care at this facility.  PASARR submitted to EDS on 11/15/2013 PASARR number received on 11/15/2013  FL2 transmitted to all facilities in geographic area requested by pt/family on  11/17/2013 FL2 transmitted to all facilities within larger geographic area on   Patient informed that his/her managed care company has contracts with or will negotiate with  certain facilities, including the following:     Patient/family informed of bed offers received:  7/13/1 Patient chooses bed at [facility] Physician recommends and patient chooses bed at    Patient to be transferred to [facility] on  7/14 Patient to be transferred to facility by [facility] Patient and family notified of transfer on Pt is alert and oriented, pt will notify family. Name of family member notified:    The following physician request were  entered in Epic:   Additional Comments:

## 2013-11-18 ENCOUNTER — Inpatient Hospital Stay (HOSPITAL_COMMUNITY): Payer: Medicare Other

## 2013-11-18 LAB — GLUCOSE, CAPILLARY
GLUCOSE-CAPILLARY: 101 mg/dL — AB (ref 70–99)
GLUCOSE-CAPILLARY: 136 mg/dL — AB (ref 70–99)
GLUCOSE-CAPILLARY: 147 mg/dL — AB (ref 70–99)
GLUCOSE-CAPILLARY: 183 mg/dL — AB (ref 70–99)
Glucose-Capillary: 102 mg/dL — ABNORMAL HIGH (ref 70–99)
Glucose-Capillary: 100 mg/dL — ABNORMAL HIGH (ref 70–99)
Glucose-Capillary: 101 mg/dL — ABNORMAL HIGH (ref 70–99)

## 2013-11-18 MED ORDER — ONDANSETRON HCL 4 MG PO TABS
4.0000 mg | ORAL_TABLET | ORAL | Status: DC | PRN
  Administered 2013-11-19: 4 mg via ORAL
  Filled 2013-11-18: qty 1

## 2013-11-18 NOTE — Progress Notes (Signed)
    Subjective: Procedure(s) (LRB): TLIF L4-5 (N/A) 4 Days Post-Op  Patient reports pain as 5 on 0-10 scale.  Reports increased right back and leg pain reports incisional back pain   Positive void Negative bowel movement Positive flatus Negative chest pain or shortness of breath  Objective: Vital signs in last 24 hours: Temp:  [98.4 F (36.9 C)-98.5 F (36.9 C)] 98.5 F (36.9 C) (07/13 0412) Pulse Rate:  [93-95] 94 (07/13 0412) Resp:  [16] 16 (07/13 0412) BP: (108-129)/(48-72) 109/53 mmHg (07/13 0412) SpO2:  [93 %-95 %] 93 % (07/13 0412)  Intake/Output from previous day: 07/12 0701 - 07/13 0700 In: 1000 [P.O.:1000] Out: 0   Labs: No results found for this basename: WBC, RBC, HCT, PLT,  in the last 72 hours No results found for this basename: NA, K, CL, CO2, BUN, CREATININE, GLUCOSE, CALCIUM,  in the last 72 hours No results found for this basename: LABPT, INR,  in the last 72 hours  Physical Exam: Neurologically intact ABD soft Intact pulses distally Incision: dressing C/D/I Compartment soft  Assessment/Plan: Patient stable  xrays satisfactory Continue mobilization with physical therapy Continue care  Up with therapy Discharge to SNF after review of CT scan Bowel meds for constipation CT scan to evaluate hardware position given ongoing back and right leg pain  Melina Schools, MD [place] [phone]

## 2013-11-18 NOTE — Discharge Summary (Signed)
Patient ID: Cheyenne Welch MRN: [id number] DOB/AGE: Sep 13, 1960 53 y.o.  Admit date: 11/14/2013 Discharge date: 11/18/2013  Admission Diagnoses:  Active Problems:   Back pain   Discharge Diagnoses:  Active Problems:   Back pain  status post Procedure(s): TLIF L4-5  Past Medical History  Diagnosis Date  . Anxiety   . Hypertension   . Diabetes mellitus without complication   . Depression   . Headache(784.0)   . Fibromyalgia   . Arthritis   . Carpal tunnel syndrome     both hands    Surgeries: Procedure(s): TLIF L4-5 on 11/14/2013   Consultants:    Discharged Condition: Improved  Hospital Course: Cheyenne Welch is an 53 y.o. female who was admitted 11/14/2013 for operative treatment of lumbar stenosis. Patient failed conservative treatments (please see the history and physical for the specifics) and had severe unremitting pain that affects sleep, daily activities and work/hobbies. After pre-op clearance, the patient was taken to the operating room on 11/14/2013 and underwent  Procedure(s): TLIF L4-5.    Patient was given perioperative antibiotics: Anti-infectives   Start     Dose/Rate Route Frequency Ordered Stop   11/14/13 2030  ceFAZolin (ANCEF) IVPB 1 g/50 mL premix     1 g 100 mL/hr over 30 Minutes Intravenous Every 8 hours 11/14/13 1834 11/15/13 0733       Patient was given sequential compression devices and early ambulation to prevent DVT.   Patient benefited maximally from hospital stay and there were no complications. At the time of discharge, the patient was urinating/moving their bowels without difficulty, tolerating a regular diet, pain is controlled with oral pain medications and they have been cleared by PT/OT.   Recent vital signs: Patient Vitals for the past 24 hrs:  BP Temp Temp src Pulse Resp SpO2  11/18/13 0412 109/53 mmHg 98.5 F (36.9 C) Oral 94 16 93 %  11/17/13 2108 108/48 mmHg 98.4 F (36.9 C) Oral 93 16 95 %     Recent laboratory  studies: No results found for this basename: WBC, HGB, HCT, PLT, NA, K, CL, CO2, BUN, CREATININE, GLUCOSE, PT, INR, CALCIUM, 2,  in the last 72 hours   Discharge Medications:     Medication List    STOP taking these medications       aspirin 81 MG tablet      TAKE these medications       albuterol 108 (90 BASE) MCG/ACT inhaler  Commonly known as:  PROVENTIL HFA;VENTOLIN HFA  Inhale 2 puffs into the lungs every 6 (six) hours as needed for wheezing or shortness of breath.     ALPRAZolam 0.5 MG tablet  Commonly known as:  XANAX  Take 0.5 mg by mouth 2 (two) times daily as needed for anxiety.     buPROPion 300 MG 24 hr tablet  Commonly known as:  WELLBUTRIN XL  Take 300 mg by mouth daily.     docusate sodium 100 MG capsule  Commonly known as:  COLACE  Take 1 capsule (100 mg total) by mouth 2 (two) times daily.     gabapentin 300 MG capsule  Commonly known as:  NEURONTIN  Take 300 mg by mouth at bedtime.     glipiZIDE 5 MG tablet  Commonly known as:  GLUCOTROL  Take by mouth 2 (two) times daily before a meal.     losartan 50 MG tablet  Commonly known as:  COZAAR  Take 50 mg by mouth daily.  metFORMIN 500 MG (MOD) 24 hr tablet  Commonly known as:  GLUMETZA  Take 1,000 mg by mouth 2 (two) times daily with a meal.     methocarbamol 500 MG tablet  Commonly known as:  ROBAXIN  Take 1 tablet (500 mg total) by mouth every 6 (six) hours as needed for muscle spasms.     ondansetron 4 MG disintegrating tablet  Commonly known as:  ZOFRAN ODT  Take 1 tablet (4 mg total) by mouth every 8 (eight) hours as needed.     oxyCODONE-acetaminophen 10-325 MG per tablet  Commonly known as:  PERCOCET  Take 1 tablet by mouth every 6 (six) hours as needed for pain.     polyethylene glycol packet  Commonly known as:  MIRALAX / GLYCOLAX  Take 17 g by mouth daily.     venlafaxine XR 150 MG 24 hr capsule  Commonly known as:  EFFEXOR-XR  Take 150 mg by mouth daily with breakfast.         Diagnostic Studies: Dg Chest 2 View  11/13/2013   CLINICAL DATA:  Back pain.  EXAM: CHEST  2 VIEW  COMPARISON:  None.  FINDINGS: Two views of the chest demonstrate clear lungs. Heart and mediastinum are within normal limits. The trachea is midline. No evidence for pleural effusions. No acute bone abnormality.  IMPRESSION: No active cardiopulmonary disease.   Electronically Signed   By: Richarda Overlie M.D.   On: 11/13/2013 14:43   Dg Lumbar Spine 2-3 Views  11/15/2013   CLINICAL DATA:  Postoperative back pain.  EXAM: LUMBAR SPINE - 2-3 VIEW  COMPARISON:  11/14/2013  FINDINGS: Posterior and interbody fusion hardware appears stable. No complicating features.  IMPRESSION: L4-5 fusion hardware in good position without complicating features.   Electronically Signed   By: Loralie Champagne M.D.   On: 11/15/2013 10:41   Dg Lumbar Spine 2-3 Views  11/14/2013   CLINICAL DATA:  Lateral, portable lumbar spine images. From posterior lumbar spine fusion.  EXAM: LUMBAR SPINE - 2-3 VIEW; DG C-ARM GT 120 MIN  COMPARISON:  Lumbar MRI, 07/29/2010  FINDINGS: Pedicle screws and interconnecting rods fuse L4-L5. Orthopedic hardware is well-seated and aligned. There is a metallic disc spacer well centered in the L4-L5 disc interspace. No evidence of an operative complication.  IMPRESSION: Postoperative lateral lumbar spine image as described.   Electronically Signed   By: Amie Portland M.D.   On: 11/14/2013 16:21   Ct Lumbar Spine Wo Contrast  11/18/2013   CLINICAL DATA:  Low back and right hip and leg pain. Lumbar fusion at L4-5 on 11/14/2013  EXAM: CT LUMBAR SPINE WITHOUT CONTRAST  TECHNIQUE: Multidetector CT imaging of the lumbar spine was performed without intravenous contrast administration. Multiplanar CT image reconstructions were also generated.  COMPARISON:  Radiographs dated 07/10 and 11/14/2013 and MRI dated 07/29/2010  FINDINGS: The scan extends from the superior aspect of T12 through the inferior aspect of S1.  T12-L1  through L3-4: Small broad-based disc bulges at several levels with no neural impingement, unchanged.  L4-5: The patient has undergone posterior and interbody fusion. The pedicle screws appear in excellent position with no evidence of loosening. Interbody fusion devices in excellent position. The right facet joint has been resected with excellent decompression of the right lateral recess. Posterior osteophytes and disc bulge are unchanged. The neural foramina are narrow bilaterally, right greater than left, but the nerves appear to exit without impingement.  There is the expected degree of ill-defined soft tissue  density at the surgical site.  L5-S1:  Normal.  IMPRESSION: 1. Postsurgical changes at L4-5 to the expected degree. Detail is somewhat obscured by the hardware and postsurgical soft tissue reaction. 2. Stable appearance of the remainder of the lumbar spine.   Electronically Signed   By: Geanie Cooley M.D.   On: 11/18/2013 15:03   Dg C-arm Gt 120 Min  11/14/2013   CLINICAL DATA:  Lateral, portable lumbar spine images. From posterior lumbar spine fusion.  EXAM: LUMBAR SPINE - 2-3 VIEW; DG C-ARM GT 120 MIN  COMPARISON:  Lumbar MRI, 07/29/2010  FINDINGS: Pedicle screws and interconnecting rods fuse L4-L5. Orthopedic hardware is well-seated and aligned. There is a metallic disc spacer well centered in the L4-L5 disc interspace. No evidence of an operative complication.  IMPRESSION: Postoperative lateral lumbar spine image as described.   Electronically Signed   By: Amie Portland M.D.   On: 11/14/2013 16:21        Discharge Plan:  discharge to snf      Signed: Naida Sleight for Dr. Venita Lick [facility] [phone] 11/18/2013, 3:43 PM

## 2013-11-18 NOTE — Progress Notes (Addendum)
CSW has provided bed offers to pt. Pt would like to accept placement at [facility]. CSW to assist with dc once pt is medically stable for discharge.  CSW has notified unit RN that the facility will need to have dc summary done by 3:30p in order for pt medications to be delivered by the pharmacy in time.   Hunt Oris, MSW, [place]

## 2013-11-18 NOTE — Progress Notes (Signed)
PA - Jeneen Rinks notified - SNF bed possible today  If CT ok. And D/C Summary in by 1530 per SW.

## 2013-11-18 NOTE — Progress Notes (Signed)
Physical Therapy Treatment Patient Details Name: Cheyenne Welch MRN: [id number] DOB: Apr 11, 1961 Today's Date: 27-Nov-2013    History of Present Illness Cheyenne Welch is a 53 y.o. Female s/p PLIF 1 level. PMH of anxiety and HTN.     PT Comments    Slow progress with mobility and gait.  Continued to be limited by pain.  Follow Up Recommendations  SNF     Equipment Recommendations  Other (comment) (TBD)    Recommendations for Other Services       Precautions / Restrictions Precautions Precautions: Back;Fall Precaution Comments: Reviewed back precautions. Required Braces or Orthoses: Spinal Brace Spinal Brace: Lumbar corset;Applied in sitting position Restrictions Weight Bearing Restrictions: No    Mobility  Bed Mobility                  Transfers Overall transfer level: Needs assistance Equipment used: Rolling walker (2 wheeled) Transfers: Sit to/from Stand Sit to Stand: Min assist         General transfer comment: Cues for hand placement and technique.  Patient requires increased time for transfers, and requests high surfaces.  Refused to sit in chair, sat EOB with bed initially raised.  Once sitting, bed moved to lowest position for safety.  Ambulation/Gait Ambulation/Gait assistance: Min assist Ambulation Distance (Feet): 52 Feet Assistive device: Rolling walker (2 wheeled) Gait Pattern/deviations: Step-through pattern;Decreased stride length;Shuffle Gait velocity: Very slow Gait velocity interpretation: Below normal speed for age/gender General Gait Details: Cues to stand upright. Relies heavily on RW.   Stairs            Wheelchair Mobility    Modified Rankin (Stroke Patients Only)       Balance                                    Cognition Arousal/Alertness: Awake/alert Behavior During Therapy: Anxious Overall Cognitive Status: Within Functional Limits for tasks assessed                      Exercises       General Comments        Pertinent Vitals/Pain Pain 10/10 with mobility.    Home Living                      Prior Function            PT Goals (current goals can now be found in the care plan section) Progress towards PT goals: Progressing toward goals    Frequency  Min 5X/week    PT Plan Current plan remains appropriate    Co-evaluation             End of Session Equipment Utilized During Treatment: Back brace Activity Tolerance: Patient limited by pain;Patient limited by fatigue Patient left: in bed;with call bell/phone within reach (sitting EOB)     Time: 1021-1173 PT Time Calculation (min): 24 min  Charges:  $Gait Training: 23-37 mins                    G Codes:      Cheyenne Welch 11/27/13, 11:06 AM Cheyenne Welch. Cheyenne Welch, Cheyenne Welch Pager [phone]

## 2013-11-18 NOTE — Progress Notes (Addendum)
CSW unable to reach both admissions rep at [facility]. Dc summary sent however unable to confirm pt is able to transport this late in the afternoon. CSW to assist with dc tomorrow.  *DC packet placed in shadow chart.  Hunt Oris, MSW, [place]

## 2013-11-19 ENCOUNTER — Inpatient Hospital Stay
Admission: RE | Admit: 2013-11-19 | Discharge: 2013-12-04 | Disposition: A | Discharge status: Home / Self Care | Payer: Medicare Other | Source: Ambulatory Visit | Attending: Internal Medicine | Admitting: Internal Medicine

## 2013-11-19 ENCOUNTER — Other Ambulatory Visit: Payer: Self-pay | Admitting: *Deleted

## 2013-11-19 LAB — GLUCOSE, CAPILLARY
GLUCOSE-CAPILLARY: 88 mg/dL (ref 70–99)
Glucose-Capillary: 103 mg/dL — ABNORMAL HIGH (ref 70–99)
Glucose-Capillary: 78 mg/dL (ref 70–99)
Glucose-Capillary: 82 mg/dL (ref 70–99)

## 2013-11-19 MED ORDER — OXYCODONE-ACETAMINOPHEN 10-325 MG PO TABS: 1.0000 | ORAL_TABLET | Freq: Four times a day (QID) | ORAL | Status: AC | PRN

## 2013-11-19 MED ORDER — POLYETHYLENE GLYCOL 3350 17 G PO PACK
17.0000 g | PACK | Freq: Every day | ORAL | Status: DC
  Administered 2013-11-19: 17 g via ORAL
  Filled 2013-11-19 (×2): qty 1

## 2013-11-19 MED ORDER — ALPRAZOLAM 0.5 MG PO TABS: ORAL_TABLET | ORAL | Status: AC

## 2013-11-19 NOTE — Telephone Encounter (Signed)
[facility] 

## 2013-11-19 NOTE — Progress Notes (Signed)
Patient's Bp has been running consistently low. At discharge, she was 31/62. Benjiman Core PA-C was paged and made aware. He said she was ready for d/c to [facility].

## 2013-11-19 NOTE — Progress Notes (Signed)
Physical Therapy Treatment Patient Details Name: Cheyenne Welch MRN: [id number] DOB: [birth year] Today's Date: 11/19/2013    History of Present Illness Cheyenne Welch is a 53 y.o. Female s/p PLIF 1 level. PMH of anxiety and HTN.     PT Comments    Pt needed increased time for all activities. Pt very limited by pain during session and not putting much weight through RLE. Pt had to stop for a short rest break during amb. Pt is not very motivated to be anywhere else except in the bed. Continue to recommend SNF for ongoing Physical Therapy.     Follow Up Recommendations  SNF     Equipment Recommendations  Other (comment)    Recommendations for Other Services       Precautions / Restrictions Precautions Precautions: Back;Fall Precaution Comments: Reviewed back precautions. Required Braces or Orthoses: Spinal Brace Spinal Brace: Lumbar corset    Mobility  Bed Mobility Overal bed mobility: Needs Assistance Bed Mobility: Sit to Supine;Rolling Rolling: Min assist     Sit to supine: Min guard Sit to sidelying: Mod assist General bed mobility comments: limited by pain. Pt needed help swinging legs onto bed into sidelying. verbal cues needed for hand and arm placements when going into sidelying.   Transfers Overall transfer level: Needs assistance Equipment used: Rolling walker (2 wheeled) Transfers: Sit to/from Stand Sit to Stand: Min guard         General transfer comment: cues for handplacement and technique. Pt required increased time and bed to be elevated. Refused to sit in chair. Bed lowered for sitting for safety.   Ambulation/Gait Ambulation/Gait assistance: Min guard Ambulation Distance (Feet): 52 Feet Assistive device: Rolling walker (2 wheeled) Gait Pattern/deviations: Step-to pattern;Decreased stride length;Antalgic Gait velocity: Very slow and painful. putting most of her weight on her LLE.  Gait velocity interpretation: Below normal speed for  age/gender General Gait Details: Relies heavily on RW. Needed cues and instruction for sequencing with RW.    Stairs            Wheelchair Mobility    Modified Rankin (Stroke Patients Only)       Balance                                    Cognition Arousal/Alertness: Awake/alert Behavior During Therapy: WFL for tasks assessed/performed Overall Cognitive Status: Within Functional Limits for tasks assessed                      Exercises      General Comments        Pertinent Vitals/Pain 10/10 when moving. 6/10 when not moving. Pt repositioned in bed in supine with HOB elevated for comfort with brace on per pt request.     Home Living                      Prior Function            PT Goals (current goals can now be found in the care plan section) Progress towards PT goals: Progressing toward goals    Frequency  Min 5X/week    PT Plan Current plan remains appropriate    Co-evaluation             End of Session Equipment Utilized During Treatment: Gait belt;Back brace Activity Tolerance: Patient limited by pain Patient left: in bed;with call bell/phone within reach  Time: 1308-6578 PT Time Calculation (min): 17 min  Charges:                       G Codes:      BRASFIELD,Shaheen Star, SPTA 11/19/2013, 11:00 AM

## 2013-11-19 NOTE — Progress Notes (Signed)
Seen and agreed 11/19/2013 Jacqualyn Posey PTA [phone] pager [phone] office

## 2013-11-19 NOTE — Progress Notes (Addendum)
Subjective: C/o right sided back pain and radicular right leg pain.  No complaints of incontinence.   Has bed available at [facility] center.  Not getting up to ambulate much.    Objective: Vital signs in last 24 hours: Temp:  [97.9 F (36.6 C)-99.6 F (37.6 C)] 97.9 F (36.6 C) (07/14 0425) Pulse Rate:  [86-94] 86 (07/14 0425) Resp:  [16-18] 16 (07/14 0425) BP: (90-116)/(50-60) 90/50 mmHg (07/14 0425) SpO2:  [90 %-95 %] 90 % (07/14 0425)  Intake/Output from previous day: 07/13 0701 - 07/14 0700 In: 560 [P.O.:560] Out: 320 [Urine:320] Intake/Output this shift:    No results found for this basename: HGB,  in the last 72 hours No results found for this basename: WBC, RBC, HCT, PLT,  in the last 72 hours No results found for this basename: NA, K, CL, CO2, BUN, CREATININE, GLUCOSE, CALCIUM,  in the last 72 hours No results found for this basename: LABPT, INR,  in the last 72 hours  Exam:  Wounds look good.  steris intact.  No drainage or signs of infection.  bilat calves nontender.  Neurologically intact.    Assessment/Plan: Anticipated transfer to SNF today after Dr Rolena Infante reviews CT this AM.  Must mobilize. Stressed to Cheyenne Welch the importance of getting up and moving.   F/u in our office 2 weeks postop.     Cheyenne Welch,Cheyenne Welch 11/19/2013, 7:36 AM     CT scan satisfactory No hardware complications No evidence of nerve compression Will monitor leg pain Remains NVI Ok for SNF/rehab d/c

## 2013-11-20 ENCOUNTER — Non-Acute Institutional Stay (SKILLED_NURSING_FACILITY): Payer: Medicare Other | Admitting: Internal Medicine

## 2013-11-20 DIAGNOSIS — Z9889 Other specified postprocedural states: Secondary | ICD-10-CM

## 2013-11-20 DIAGNOSIS — E1149 Type 2 diabetes mellitus with other diabetic neurological complication: Secondary | ICD-10-CM

## 2013-11-20 DIAGNOSIS — R29898 Other symptoms and signs involving the musculoskeletal system: Secondary | ICD-10-CM

## 2013-11-20 LAB — GLUCOSE, CAPILLARY
GLUCOSE-CAPILLARY: 86 mg/dL (ref 70–99)
GLUCOSE-CAPILLARY: 85 mg/dL (ref 70–99)
Glucose-Capillary: 86 mg/dL (ref 70–99)
Glucose-Capillary: 56 mg/dL — ABNORMAL LOW (ref 70–99)
Glucose-Capillary: 80 mg/dL (ref 70–99)
Glucose-Capillary: 171 mg/dL — ABNORMAL HIGH (ref 70–99)

## 2013-11-21 NOTE — Discharge Summary (Signed)
Stable Complaining of right  Leg pain No neuro deficits  CT scan reviewed  Ok for d/c - f/u in 2 weeks

## 2013-11-22 LAB — GLUCOSE, CAPILLARY: GLUCOSE-CAPILLARY: 125 mg/dL — AB (ref 70–99)

## 2013-11-25 LAB — GLUCOSE, CAPILLARY
GLUCOSE-CAPILLARY: 129 mg/dL — AB (ref 70–99)
Glucose-Capillary: 172 mg/dL — ABNORMAL HIGH (ref 70–99)

## 2013-11-25 NOTE — Progress Notes (Addendum)
Patient ID: Cheyenne Welch, female   DOB: 06-21-1960, 53 y.o.   MRN: [id number]               HISTORY & PHYSICAL  DATE:  11/20/2013     FACILITY: [facility]    LEVEL OF CARE:   SNF   CHIEF COMPLAINT:  Admission to SNF, post stay at [facility], 11/14/2013 through 11/18/2013.    HISTORY OF PRESENT ILLNESS:  This patient was admitted for elective treatment of lumbar spinal stenosis.  She had unrelenting pain, even at rest.  She underwent surgery on 11/14/2013.  This was a lumbar laminectomy and fusion of L4/L5.   The patient is still complaining of unrelenting pain radiating down the back of her right greater than left leg.  This is similar to what she had preoperatively.   She thinks the left leg is somewhat better.    PAST MEDICAL HISTORY/PROBLEM LIST:  Includes:    Anxiety with depression.    Hypertension.    Type 2 diabetes without complications.    History of headaches.    History of fibromyalgia.    Carpal tunnel syndrome.    CURRENT MEDICATIONS:  Discharge medications include:    Albuterol HFA q.6 p.r.n.    Xanax 0.5 b.i.d.    Wellbutrin XL 300 mg daily.    Colace 100 b.i.d.    Neurontin 300 at bedtime.    Glucotrol 5 mg b.i.d.    Cozaar 50 q.d.    Metformin 24 hours, 1000 mg b.i.d.    Robaxin 500 q.6.    Ondansetron 4 mg every 8 hours as needed.    Percocet 10/325, 1 tablet q.6.    MiraLAX 17 g daily.    Effexor XR 150 mg daily with breakfast.    SOCIAL HISTORY:   HOUSING:  The patient tells me she is from [place].  Lives with, alternatively, her boyfriend and sister in [place] and [place].     FUNCTIONAL STATUS:  She used a cane at home.   TOBACCO USE:  She is a non-smoker.    REVIEW OF SYSTEMS:   GI:  States she had a bowel movement this morning.   GU:  She has a preoperative problem of tremendous urinary frequency.  States than when she has to void, she has to go immediately.  This sounds like a lifestyle-impacting problem.  She  does not complain of dysuria.    PHYSICAL EXAMINATION:   GENERAL APPEARANCE:  The patient is awake, alert.  Very difficult for her to even turn over in bed.   CHEST/RESPIRATORY:  Clear air entry bilaterally.   CARDIOVASCULAR:  CARDIAC:   Heart sounds are normal.  There are no murmurs.   GASTROINTESTINAL:  ABDOMEN:   Somewhat obese, perhaps slightly distended.  Bowel sounds are positive.   LIVER/SPLEEN/KIDNEYS:  No liver, no spleen.   GENITOURINARY:  BLADDER:   Not distended.   NEUROLOGICAL:    SENSATION/STRENGTH:  Wasting of her quadriceps is noted.   DEEP TENDON REFLEXES:  Reflexes and ankle jerks in the left leg are equal to the right.  She has an absent right ankle jerk.  However, the left is maintained.  Both plantar responses are flexor.    ASSESSMENT/PLAN:  Status post L4/L5 laminectomy and fusion.  She is still in a lot of pain radiating down the right greater than left leg.    Significant lower extremity weakness bilaterally.    She, especially on the right side, has barely antigravity strength.  Hip abductor strength is 4/5.  Ankle plantar and dorsiflexion are 5/5.  This is going to take a lot of physical therapy.  It will be interesting to see what she is able to do in upright position.    Type 2 diabetes.  On oral agents.  She has some loss of sensation in her toes, which may be neuropathic.    Depression with anxiety.     LABORATORY DATA:   Lab work shows a white count of 6, a hemoglobin of 10.7.  Differential count is normal.   Comprehensive metabolic panel:  Slight elevation of her AST at 42, her ALT at 51.

## 2013-11-25 NOTE — H&P (Signed)
Cheyenne Welch is an 53 y.o. female.   Chief Complaint:  Low back pain and leg pain HPI:  53 yo wf with hx of lumbar stenosis presented to our office for preop evaluation.  Progressively worsening symptoms.    Past Medical History  Diagnosis Date  . Anxiety   . Hypertension   . Diabetes mellitus without complication   . Depression   . Headache(784.0)   . Fibromyalgia   . Arthritis   . Carpal tunnel syndrome     both hands    Past Surgical History  Procedure Laterality Date  . Fracture right big toe    . Ankle surgery Right   . Cesarean section    . Transforaminal lumbar interbody fusion (tlif) with pedicle screw fixation 4 level  11/14/2013    L4 L5     BY DR Rolena Infante    Family History  Problem Relation Age of Onset  . Cancer Mother   . Hypertension Mother   . Hypertension Father   . Diabetes Father   . Cancer Father   . Hypertension Brother    Social History:  reports that she has never smoked. She has never used smokeless tobacco. She reports that she drinks about .6 ounces of alcohol per week. She reports that she does not use illicit drugs.  Allergies:  Allergies  Allergen Reactions  . Vicodin [Hydrocodone-Acetaminophen] Nausea And Vomiting    Medications Prior to Admission  Medication Sig Dispense Refill  . albuterol (PROVENTIL HFA;VENTOLIN HFA) 108 (90 BASE) MCG/ACT inhaler Inhale 2 puffs into the lungs every 6 (six) hours as needed for wheezing or shortness of breath.      . ALPRAZolam (XANAX) 0.5 MG tablet Take one tablet by mouth twice daily as needed for anxiety  60 tablet  5  . buPROPion (WELLBUTRIN XL) 300 MG 24 hr tablet Take 300 mg by mouth daily.      Marland Kitchen docusate sodium (COLACE) 100 MG capsule Take 1 capsule (100 mg total) by mouth 2 (two) times daily.  50 capsule  0  . gabapentin (NEURONTIN) 300 MG capsule Take 300 mg by mouth at bedtime.      Marland Kitchen glipiZIDE (GLUCOTROL) 5 MG tablet Take by mouth 2 (two) times daily before a meal.      . losartan (COZAAR)  50 MG tablet Take 50 mg by mouth daily.      . metFORMIN (GLUMETZA) 500 MG (MOD) 24 hr tablet Take 1,000 mg by mouth 2 (two) times daily with a meal.      . methocarbamol (ROBAXIN) 500 MG tablet Take 1 tablet (500 mg total) by mouth every 6 (six) hours as needed for muscle spasms.  60 tablet  0  . ondansetron (ZOFRAN ODT) 4 MG disintegrating tablet Take 1 tablet (4 mg total) by mouth every 8 (eight) hours as needed.  40 tablet  0  . oxyCODONE-acetaminophen (PERCOCET) 10-325 MG per tablet Take 1 tablet by mouth every 6 (six) hours as needed for pain.  120 tablet  0  . polyethylene glycol (MIRALAX / GLYCOLAX) packet Take 17 g by mouth daily.  30 each  0  . venlafaxine XR (EFFEXOR-XR) 150 MG 24 hr capsule Take 150 mg by mouth daily with breakfast.        No results found for this or any previous visit (from the past 48 hour(s)). No results found.  Review of Systems  Constitutional: Negative.   HENT: Negative.   Eyes: Negative.   Respiratory: Negative.  Cardiovascular: Negative.   Gastrointestinal: Negative.   Musculoskeletal: Positive for back pain and myalgias.  Skin: Negative.   Neurological: Positive for tingling.    Blood pressure 90/50, pulse 86, temperature 97.9 F (36.6 C), temperature source Oral, resp. rate 16, height 5' 2.5" (1.588 m), weight 110.904 kg (244 lb 8 oz), SpO2 90.00%. Physical Exam  Constitutional: She is oriented to person, place, and time. She appears well-developed.  HENT:  Head: Normocephalic and atraumatic.  Eyes: EOM are normal. Pupils are equal, round, and reactive to light.  Neck: Normal range of motion.  Cardiovascular: Normal rate and regular rhythm.   Respiratory: Effort normal and breath sounds normal.  GI: Soft. Bowel sounds are normal.  Neurological: She is alert and oriented to person, place, and time.  Skin: Skin is warm.     Assessment/Plan Lumbar stenosis.  Will proceed with surgery as scheduled.  Procedure along with potential risks and  complications discussed.  All questions answered.    Shaunn Tackitt M 11/25/2013, 8:22 AM

## 2013-11-27 LAB — GLUCOSE, CAPILLARY
GLUCOSE-CAPILLARY: 181 mg/dL — AB (ref 70–99)
Glucose-Capillary: 85 mg/dL (ref 70–99)

## 2013-11-29 LAB — GLUCOSE, CAPILLARY
Glucose-Capillary: 113 mg/dL — ABNORMAL HIGH (ref 70–99)
Glucose-Capillary: 137 mg/dL — ABNORMAL HIGH (ref 70–99)

## 2013-12-02 LAB — GLUCOSE, CAPILLARY: Glucose-Capillary: 129 mg/dL — ABNORMAL HIGH (ref 70–99)

## 2013-12-03 ENCOUNTER — Encounter: Payer: Self-pay | Admitting: Internal Medicine

## 2013-12-03 ENCOUNTER — Non-Acute Institutional Stay (SKILLED_NURSING_FACILITY): Payer: Medicare Other | Admitting: Internal Medicine

## 2013-12-03 DIAGNOSIS — E1149 Type 2 diabetes mellitus with other diabetic neurological complication: Secondary | ICD-10-CM

## 2013-12-03 DIAGNOSIS — F329 Major depressive disorder, single episode, unspecified: Secondary | ICD-10-CM

## 2013-12-03 DIAGNOSIS — Z9889 Other specified postprocedural states: Secondary | ICD-10-CM

## 2013-12-03 DIAGNOSIS — I10 Essential (primary) hypertension: Secondary | ICD-10-CM

## 2013-12-03 DIAGNOSIS — F32A Depression, unspecified: Secondary | ICD-10-CM

## 2013-12-03 DIAGNOSIS — F3289 Other specified depressive episodes: Secondary | ICD-10-CM

## 2013-12-03 LAB — GLUCOSE, CAPILLARY: Glucose-Capillary: 137 mg/dL — ABNORMAL HIGH (ref 70–99)

## 2013-12-03 NOTE — Progress Notes (Signed)
Patient ID: Rania Cottom, female   DOB: 25-May-1960, 54 y.o.   MRN: [id number]   FACILITY: [facility]  LEVEL OF CARE: SNF This is a discharge note.    CHIEF COMPLAINT: Discharge note.  Marland Kitchen  HISTORY OF PRESENT ILLNESS:  patient was admitted for elective treatment of lumbar spinal stenosis. She had unrelenting pain, even at rest. She underwent surgery on 11/14/2013. This was a lumbar laminectomy and fusion of L4/L5. Initially she had significant pain but this appears to be much improved she is receiving Percocet apparently with good effect-she is up ambulating and doing quite well.  She would benefit from outpatient therapy-she lives with her sister and boyfriend-.  She is a diabetic-she is on glipizide as well as Glucophage her blood sugars appear to be quite stable running largely in the lower 100s  Some  mid 100s later in the day  She does have some history of anxiety and depression but this has been relatively stable here she has been seen by psychiatric services-she continues on Wellbutrin-she also is on Xanax when necessary  Tonight she has no complaints is looking forward to going home states she will be with her boyfriend sometimes --and  with her sister and other times .  PAST MEDICAL HISTORY/PROBLEM LIST: Includes:  Anxiety with depression.  Hypertension.  Type 2 diabetes without complications.  History of headaches.  History of fibromyalgia.  Carpal tunnel syndrome.  CURRENT MEDICATIONS: Discharge medications include:  Albuterol HFA q.6 p.r.n.  Xanax 0.5 b.i.d.  Wellbutrin XL 300 mg daily.  Colace 100 b.i.d.  Neurontin 300 at bedtime.  Glucotrol 5 mg b.i.d.  Cozaar 50 q.d.  Metformin 24 hours, 1000 mg b.i.d.  Robaxin 500 q.6.   Percocet 10/325, 1 tablet q.6.  MiraLAX 17 g daily.  Effexor XR 150 mg daily with breakfast.  SOCIAL HISTORY:  HOUSING: The patient tells me she is from [place]. Lives with, alternatively, her boyfriend and sister in [place] and  [place].  FUNCTIONAL STATUS: She used a cane at home.  TOBACCO USE: She is a non-smoke r.  REVIEW OF SYSTEMS Neuro no complaints of fever or chills.  Skin does not complaining of any itching or rashes surgical site back appears to be healing unremarkably.  Head ears eyes nose mouth and throat-does not visual changes or sore throat she has prescription lenses.  Respiratory no complaint shortness of breath or cough.  Cardiac no chest pain minimal lower extremity edema.  GI does not complaining of constipation nausea or vomiting or abdominal pain.  Muscle skeletal says her back pain is significantly better she is ambulating well it appears in the hallway she wears a brace.  Neurologic-has some history of neuropathy this is this is intermittent of her lower extremities and feet currently not complaining of this.  Psych history of depression and anxiety which appears to be quite stable:      PHYSICAL EXAMINATION Temperature 97.7 pulse 78 respirations 19 blood pressure 101/48 all these appear to be relatively baseline her weight has been stable at 241.8 pounds:  GENERAL APPEARANCE: Middle-aged female in no distress.  Her skin is warm and dry -- small surgical sites lower back appear to be healing unremarkably there is no tenderness drainage bleeding or sign of infection-or surrounding erythema there is some crusting healing here Eyes-pupils appear reactive she has prescription lenses visual acuity appears grossly intact  CHEST/RESPIRATORY: Clear air entry bilaterally--no  labored breathing.  CARDIOVASCULAR:  CARDIAC: Heart sounds are normal. There are no murmurs.--Minimal  lower extremity edema positive pedal pulses  GASTROINTESTINAL:  ABDOMEN: Somewhat obese, perhaps slightly distended. Bowel sounds are positive no tenderness Muscle skeletal-is ambulatory -- does wear a back brace it appears when ambulating in the hall moves all her extremities x4.--Upper extremity strength appears intact  as appears she has gained significant strength of her lower extremities during her stay here is ambulating without significant pain  Neurologic appears grossly intact no lateralizing findings her speech is clear.  Psych she is pleasant and appropriate alert and oriented x3     Labs.Marland Kitchen  11/20/2013.  WBC 6.0 hemoglobin 10.7 platelets 203.  Sodium 141 potassium 3.8  BUN  10--albumin 2.9.    creatinine 0.59.  AST 42 ALT 51.  ASSESSMENT/PLAN:  Status post L4/L5 laminectomy and fusion.    pain appears significantly improved she is ambulating quite well-would benefit from continued therapy as out patient--she will have followup by surgery   Type 2 diabetes. On oral agents. This appears to be quite well controlled.  Depression with anxiety. --This is in stable.--On Effexor as well as Xanax when necessary  Hypertension-this has been stable and systolics appear to run largely in the lower 100s  Neuropathy-this is most prominent in her lower extremities-feet-she is on Neurontin apparently that is giving  some relief.  Pain management-again the Percocet appears to be helping -10-325 milligrams every 6 hours when necessary she's also has Robaxin every 6 hours as needed  Of note will update lab work including CBC CMP before discharge I did note some mildly elevated liver enzymes    [id number]-of note greater than 30 minutes spent preparing this discharge summary

## 2013-12-04 LAB — GLUCOSE, CAPILLARY: Glucose-Capillary: 70 mg/dL (ref 70–99)

## 2013-12-05 LAB — GLUCOSE, CAPILLARY: Glucose-Capillary: 136 mg/dL — ABNORMAL HIGH (ref 70–99)

## 2014-03-10 ENCOUNTER — Encounter: Payer: Self-pay | Admitting: Internal Medicine

## 2015-08-24 DIAGNOSIS — I872 Venous insufficiency (chronic) (peripheral): Secondary | ICD-10-CM | POA: Diagnosis not present

## 2015-08-24 DIAGNOSIS — G5602 Carpal tunnel syndrome, left upper limb: Secondary | ICD-10-CM | POA: Diagnosis not present

## 2015-08-24 DIAGNOSIS — I10 Essential (primary) hypertension: Secondary | ICD-10-CM | POA: Diagnosis not present

## 2015-08-24 DIAGNOSIS — E11610 Type 2 diabetes mellitus with diabetic neuropathic arthropathy: Secondary | ICD-10-CM | POA: Diagnosis not present

## 2015-09-10 DIAGNOSIS — M25511 Pain in right shoulder: Secondary | ICD-10-CM | POA: Diagnosis not present

## 2015-09-10 DIAGNOSIS — E11610 Type 2 diabetes mellitus with diabetic neuropathic arthropathy: Secondary | ICD-10-CM | POA: Diagnosis not present

## 2015-09-10 DIAGNOSIS — I10 Essential (primary) hypertension: Secondary | ICD-10-CM | POA: Diagnosis not present

## 2015-11-25 DIAGNOSIS — M25511 Pain in right shoulder: Secondary | ICD-10-CM | POA: Diagnosis not present

## 2015-11-25 DIAGNOSIS — M7541 Impingement syndrome of right shoulder: Secondary | ICD-10-CM | POA: Diagnosis not present

## 2016-03-03 DIAGNOSIS — N3941 Urge incontinence: Secondary | ICD-10-CM | POA: Diagnosis not present

## 2016-03-03 DIAGNOSIS — Z6841 Body Mass Index (BMI) 40.0 and over, adult: Secondary | ICD-10-CM | POA: Diagnosis not present

## 2016-03-03 DIAGNOSIS — I10 Essential (primary) hypertension: Secondary | ICD-10-CM | POA: Diagnosis not present

## 2016-03-03 DIAGNOSIS — M25511 Pain in right shoulder: Secondary | ICD-10-CM | POA: Diagnosis not present

## 2016-03-03 DIAGNOSIS — Z0000 Encounter for general adult medical examination without abnormal findings: Secondary | ICD-10-CM | POA: Diagnosis not present

## 2016-03-03 DIAGNOSIS — E11610 Type 2 diabetes mellitus with diabetic neuropathic arthropathy: Secondary | ICD-10-CM | POA: Diagnosis not present

## 2016-08-26 DIAGNOSIS — B373 Candidiasis of vulva and vagina: Secondary | ICD-10-CM | POA: Diagnosis not present

## 2016-08-26 DIAGNOSIS — B372 Candidiasis of skin and nail: Secondary | ICD-10-CM | POA: Diagnosis not present

## 2016-08-26 DIAGNOSIS — N909 Noninflammatory disorder of vulva and perineum, unspecified: Secondary | ICD-10-CM | POA: Diagnosis not present

## 2016-09-12 DIAGNOSIS — N909 Noninflammatory disorder of vulva and perineum, unspecified: Secondary | ICD-10-CM | POA: Diagnosis not present

## 2016-09-12 DIAGNOSIS — B373 Candidiasis of vulva and vagina: Secondary | ICD-10-CM | POA: Diagnosis not present

## 2016-09-21 DIAGNOSIS — E1140 Type 2 diabetes mellitus with diabetic neuropathy, unspecified: Secondary | ICD-10-CM | POA: Diagnosis not present

## 2016-09-21 DIAGNOSIS — E109 Type 1 diabetes mellitus without complications: Secondary | ICD-10-CM | POA: Diagnosis not present

## 2016-09-21 DIAGNOSIS — I10 Essential (primary) hypertension: Secondary | ICD-10-CM | POA: Diagnosis not present

## 2016-09-21 DIAGNOSIS — M797 Fibromyalgia: Secondary | ICD-10-CM | POA: Diagnosis not present

## 2016-11-11 DIAGNOSIS — F321 Major depressive disorder, single episode, moderate: Secondary | ICD-10-CM | POA: Diagnosis not present

## 2016-11-11 DIAGNOSIS — E11610 Type 2 diabetes mellitus with diabetic neuropathic arthropathy: Secondary | ICD-10-CM | POA: Diagnosis not present

## 2016-11-11 DIAGNOSIS — H018 Other specified inflammations of eyelid: Secondary | ICD-10-CM | POA: Diagnosis not present

## 2016-11-11 DIAGNOSIS — I10 Essential (primary) hypertension: Secondary | ICD-10-CM | POA: Diagnosis not present

## 2016-11-20 DIAGNOSIS — R531 Weakness: Secondary | ICD-10-CM | POA: Diagnosis not present

## 2016-11-20 DIAGNOSIS — I10 Essential (primary) hypertension: Secondary | ICD-10-CM | POA: Diagnosis not present

## 2016-11-20 DIAGNOSIS — N611 Abscess of the breast and nipple: Secondary | ICD-10-CM | POA: Diagnosis not present

## 2016-11-20 DIAGNOSIS — B9561 Methicillin susceptible Staphylococcus aureus infection as the cause of diseases classified elsewhere: Secondary | ICD-10-CM | POA: Diagnosis not present

## 2016-11-20 DIAGNOSIS — L0233 Carbuncle of buttock: Secondary | ICD-10-CM | POA: Diagnosis not present

## 2016-11-20 DIAGNOSIS — E1152 Type 2 diabetes mellitus with diabetic peripheral angiopathy with gangrene: Secondary | ICD-10-CM | POA: Diagnosis not present

## 2016-11-20 DIAGNOSIS — L0231 Cutaneous abscess of buttock: Secondary | ICD-10-CM | POA: Diagnosis not present

## 2016-11-20 DIAGNOSIS — Z6841 Body Mass Index (BMI) 40.0 and over, adult: Secondary | ICD-10-CM | POA: Diagnosis not present

## 2016-11-20 DIAGNOSIS — Z79899 Other long term (current) drug therapy: Secondary | ICD-10-CM | POA: Diagnosis not present

## 2016-11-20 DIAGNOSIS — E669 Obesity, unspecified: Secondary | ICD-10-CM | POA: Diagnosis not present

## 2016-11-20 DIAGNOSIS — J45909 Unspecified asthma, uncomplicated: Secondary | ICD-10-CM | POA: Diagnosis not present

## 2016-11-20 DIAGNOSIS — Z7984 Long term (current) use of oral hypoglycemic drugs: Secondary | ICD-10-CM | POA: Diagnosis not present

## 2016-11-20 DIAGNOSIS — E119 Type 2 diabetes mellitus without complications: Secondary | ICD-10-CM | POA: Diagnosis not present

## 2016-11-20 DIAGNOSIS — I96 Gangrene, not elsewhere classified: Secondary | ICD-10-CM | POA: Diagnosis not present

## 2016-11-28 DIAGNOSIS — N611 Abscess of the breast and nipple: Secondary | ICD-10-CM | POA: Diagnosis not present

## 2016-11-28 DIAGNOSIS — E119 Type 2 diabetes mellitus without complications: Secondary | ICD-10-CM | POA: Diagnosis not present

## 2016-11-28 DIAGNOSIS — L0231 Cutaneous abscess of buttock: Secondary | ICD-10-CM | POA: Diagnosis not present

## 2016-11-28 DIAGNOSIS — I10 Essential (primary) hypertension: Secondary | ICD-10-CM | POA: Diagnosis not present

## 2016-11-30 DIAGNOSIS — N611 Abscess of the breast and nipple: Secondary | ICD-10-CM | POA: Diagnosis not present

## 2016-11-30 DIAGNOSIS — L0231 Cutaneous abscess of buttock: Secondary | ICD-10-CM | POA: Diagnosis not present

## 2016-11-30 DIAGNOSIS — I10 Essential (primary) hypertension: Secondary | ICD-10-CM | POA: Diagnosis not present

## 2016-11-30 DIAGNOSIS — E119 Type 2 diabetes mellitus without complications: Secondary | ICD-10-CM | POA: Diagnosis not present

## 2016-12-05 DIAGNOSIS — L0231 Cutaneous abscess of buttock: Secondary | ICD-10-CM | POA: Diagnosis not present

## 2016-12-07 DIAGNOSIS — E119 Type 2 diabetes mellitus without complications: Secondary | ICD-10-CM | POA: Diagnosis not present

## 2016-12-07 DIAGNOSIS — L0231 Cutaneous abscess of buttock: Secondary | ICD-10-CM | POA: Diagnosis not present

## 2016-12-07 DIAGNOSIS — N611 Abscess of the breast and nipple: Secondary | ICD-10-CM | POA: Diagnosis not present

## 2016-12-07 DIAGNOSIS — I10 Essential (primary) hypertension: Secondary | ICD-10-CM | POA: Diagnosis not present

## 2016-12-09 DIAGNOSIS — I10 Essential (primary) hypertension: Secondary | ICD-10-CM | POA: Diagnosis not present

## 2016-12-09 DIAGNOSIS — E119 Type 2 diabetes mellitus without complications: Secondary | ICD-10-CM | POA: Diagnosis not present

## 2016-12-09 DIAGNOSIS — N611 Abscess of the breast and nipple: Secondary | ICD-10-CM | POA: Diagnosis not present

## 2016-12-09 DIAGNOSIS — L0231 Cutaneous abscess of buttock: Secondary | ICD-10-CM | POA: Diagnosis not present

## 2016-12-12 DIAGNOSIS — E119 Type 2 diabetes mellitus without complications: Secondary | ICD-10-CM | POA: Diagnosis not present

## 2016-12-12 DIAGNOSIS — N611 Abscess of the breast and nipple: Secondary | ICD-10-CM | POA: Diagnosis not present

## 2016-12-12 DIAGNOSIS — L0231 Cutaneous abscess of buttock: Secondary | ICD-10-CM | POA: Diagnosis not present

## 2016-12-12 DIAGNOSIS — I10 Essential (primary) hypertension: Secondary | ICD-10-CM | POA: Diagnosis not present

## 2016-12-14 DIAGNOSIS — I10 Essential (primary) hypertension: Secondary | ICD-10-CM | POA: Diagnosis not present

## 2016-12-14 DIAGNOSIS — N611 Abscess of the breast and nipple: Secondary | ICD-10-CM | POA: Diagnosis not present

## 2016-12-14 DIAGNOSIS — E119 Type 2 diabetes mellitus without complications: Secondary | ICD-10-CM | POA: Diagnosis not present

## 2016-12-14 DIAGNOSIS — L0231 Cutaneous abscess of buttock: Secondary | ICD-10-CM | POA: Diagnosis not present

## 2016-12-16 DIAGNOSIS — I10 Essential (primary) hypertension: Secondary | ICD-10-CM | POA: Diagnosis not present

## 2016-12-16 DIAGNOSIS — L0231 Cutaneous abscess of buttock: Secondary | ICD-10-CM | POA: Diagnosis not present

## 2016-12-16 DIAGNOSIS — E119 Type 2 diabetes mellitus without complications: Secondary | ICD-10-CM | POA: Diagnosis not present

## 2016-12-16 DIAGNOSIS — N611 Abscess of the breast and nipple: Secondary | ICD-10-CM | POA: Diagnosis not present

## 2016-12-19 DIAGNOSIS — L0231 Cutaneous abscess of buttock: Secondary | ICD-10-CM | POA: Diagnosis not present

## 2016-12-19 DIAGNOSIS — I10 Essential (primary) hypertension: Secondary | ICD-10-CM | POA: Diagnosis not present

## 2016-12-19 DIAGNOSIS — N611 Abscess of the breast and nipple: Secondary | ICD-10-CM | POA: Diagnosis not present

## 2016-12-19 DIAGNOSIS — E119 Type 2 diabetes mellitus without complications: Secondary | ICD-10-CM | POA: Diagnosis not present

## 2016-12-21 DIAGNOSIS — L0231 Cutaneous abscess of buttock: Secondary | ICD-10-CM | POA: Diagnosis not present

## 2016-12-21 DIAGNOSIS — E119 Type 2 diabetes mellitus without complications: Secondary | ICD-10-CM | POA: Diagnosis not present

## 2016-12-21 DIAGNOSIS — I10 Essential (primary) hypertension: Secondary | ICD-10-CM | POA: Diagnosis not present

## 2016-12-21 DIAGNOSIS — N611 Abscess of the breast and nipple: Secondary | ICD-10-CM | POA: Diagnosis not present

## 2016-12-23 DIAGNOSIS — E119 Type 2 diabetes mellitus without complications: Secondary | ICD-10-CM | POA: Diagnosis not present

## 2016-12-23 DIAGNOSIS — L0231 Cutaneous abscess of buttock: Secondary | ICD-10-CM | POA: Diagnosis not present

## 2016-12-23 DIAGNOSIS — N611 Abscess of the breast and nipple: Secondary | ICD-10-CM | POA: Diagnosis not present

## 2016-12-23 DIAGNOSIS — I10 Essential (primary) hypertension: Secondary | ICD-10-CM | POA: Diagnosis not present

## 2016-12-26 DIAGNOSIS — N611 Abscess of the breast and nipple: Secondary | ICD-10-CM | POA: Diagnosis not present

## 2016-12-26 DIAGNOSIS — E119 Type 2 diabetes mellitus without complications: Secondary | ICD-10-CM | POA: Diagnosis not present

## 2016-12-26 DIAGNOSIS — I10 Essential (primary) hypertension: Secondary | ICD-10-CM | POA: Diagnosis not present

## 2016-12-26 DIAGNOSIS — L0231 Cutaneous abscess of buttock: Secondary | ICD-10-CM | POA: Diagnosis not present

## 2016-12-28 DIAGNOSIS — N611 Abscess of the breast and nipple: Secondary | ICD-10-CM | POA: Diagnosis not present

## 2016-12-28 DIAGNOSIS — E119 Type 2 diabetes mellitus without complications: Secondary | ICD-10-CM | POA: Diagnosis not present

## 2016-12-28 DIAGNOSIS — I10 Essential (primary) hypertension: Secondary | ICD-10-CM | POA: Diagnosis not present

## 2016-12-28 DIAGNOSIS — L0231 Cutaneous abscess of buttock: Secondary | ICD-10-CM | POA: Diagnosis not present

## 2016-12-30 DIAGNOSIS — N611 Abscess of the breast and nipple: Secondary | ICD-10-CM | POA: Diagnosis not present

## 2016-12-30 DIAGNOSIS — E119 Type 2 diabetes mellitus without complications: Secondary | ICD-10-CM | POA: Diagnosis not present

## 2016-12-30 DIAGNOSIS — I10 Essential (primary) hypertension: Secondary | ICD-10-CM | POA: Diagnosis not present

## 2016-12-30 DIAGNOSIS — L0231 Cutaneous abscess of buttock: Secondary | ICD-10-CM | POA: Diagnosis not present

## 2017-01-02 DIAGNOSIS — L0231 Cutaneous abscess of buttock: Secondary | ICD-10-CM | POA: Diagnosis not present

## 2017-01-02 DIAGNOSIS — I10 Essential (primary) hypertension: Secondary | ICD-10-CM | POA: Diagnosis not present

## 2017-01-02 DIAGNOSIS — E119 Type 2 diabetes mellitus without complications: Secondary | ICD-10-CM | POA: Diagnosis not present

## 2017-01-02 DIAGNOSIS — N611 Abscess of the breast and nipple: Secondary | ICD-10-CM | POA: Diagnosis not present

## 2017-01-06 DIAGNOSIS — N611 Abscess of the breast and nipple: Secondary | ICD-10-CM | POA: Diagnosis not present

## 2017-01-06 DIAGNOSIS — I10 Essential (primary) hypertension: Secondary | ICD-10-CM | POA: Diagnosis not present

## 2017-01-06 DIAGNOSIS — E119 Type 2 diabetes mellitus without complications: Secondary | ICD-10-CM | POA: Diagnosis not present

## 2017-01-06 DIAGNOSIS — L0231 Cutaneous abscess of buttock: Secondary | ICD-10-CM | POA: Diagnosis not present

## 2017-01-11 DIAGNOSIS — N611 Abscess of the breast and nipple: Secondary | ICD-10-CM | POA: Diagnosis not present

## 2017-01-11 DIAGNOSIS — I10 Essential (primary) hypertension: Secondary | ICD-10-CM | POA: Diagnosis not present

## 2017-01-11 DIAGNOSIS — E119 Type 2 diabetes mellitus without complications: Secondary | ICD-10-CM | POA: Diagnosis not present

## 2017-01-11 DIAGNOSIS — L0231 Cutaneous abscess of buttock: Secondary | ICD-10-CM | POA: Diagnosis not present

## 2017-01-16 DIAGNOSIS — E119 Type 2 diabetes mellitus without complications: Secondary | ICD-10-CM | POA: Diagnosis not present

## 2017-01-16 DIAGNOSIS — L0231 Cutaneous abscess of buttock: Secondary | ICD-10-CM | POA: Diagnosis not present

## 2017-01-16 DIAGNOSIS — I10 Essential (primary) hypertension: Secondary | ICD-10-CM | POA: Diagnosis not present

## 2017-01-16 DIAGNOSIS — N611 Abscess of the breast and nipple: Secondary | ICD-10-CM | POA: Diagnosis not present

## 2017-01-19 DIAGNOSIS — L0231 Cutaneous abscess of buttock: Secondary | ICD-10-CM | POA: Diagnosis not present

## 2017-01-27 DIAGNOSIS — H1131 Conjunctival hemorrhage, right eye: Secondary | ICD-10-CM | POA: Diagnosis not present

## 2017-03-13 DIAGNOSIS — I10 Essential (primary) hypertension: Secondary | ICD-10-CM | POA: Diagnosis not present

## 2017-03-13 DIAGNOSIS — H018 Other specified inflammations of eyelid: Secondary | ICD-10-CM | POA: Diagnosis not present

## 2017-03-13 DIAGNOSIS — E11610 Type 2 diabetes mellitus with diabetic neuropathic arthropathy: Secondary | ICD-10-CM | POA: Diagnosis not present

## 2017-03-13 DIAGNOSIS — F321 Major depressive disorder, single episode, moderate: Secondary | ICD-10-CM | POA: Diagnosis not present

## 2017-03-13 DIAGNOSIS — G5603 Carpal tunnel syndrome, bilateral upper limbs: Secondary | ICD-10-CM | POA: Diagnosis not present

## 2017-06-29 DIAGNOSIS — E11610 Type 2 diabetes mellitus with diabetic neuropathic arthropathy: Secondary | ICD-10-CM | POA: Diagnosis not present

## 2017-06-29 DIAGNOSIS — E1140 Type 2 diabetes mellitus with diabetic neuropathy, unspecified: Secondary | ICD-10-CM | POA: Diagnosis not present

## 2017-06-29 DIAGNOSIS — I10 Essential (primary) hypertension: Secondary | ICD-10-CM | POA: Diagnosis not present

## 2017-06-29 DIAGNOSIS — G5603 Carpal tunnel syndrome, bilateral upper limbs: Secondary | ICD-10-CM | POA: Diagnosis not present

## 2017-06-29 DIAGNOSIS — M797 Fibromyalgia: Secondary | ICD-10-CM | POA: Diagnosis not present

## 2017-08-01 DIAGNOSIS — M545 Low back pain: Secondary | ICD-10-CM | POA: Diagnosis not present

## 2017-08-01 DIAGNOSIS — M5416 Radiculopathy, lumbar region: Secondary | ICD-10-CM | POA: Diagnosis not present

## 2017-08-01 DIAGNOSIS — Z9889 Other specified postprocedural states: Secondary | ICD-10-CM | POA: Diagnosis not present

## 2017-08-03 DIAGNOSIS — M5136 Other intervertebral disc degeneration, lumbar region: Secondary | ICD-10-CM | POA: Diagnosis not present

## 2017-08-03 DIAGNOSIS — M9904 Segmental and somatic dysfunction of sacral region: Secondary | ICD-10-CM | POA: Diagnosis not present

## 2017-08-03 DIAGNOSIS — M545 Low back pain: Secondary | ICD-10-CM | POA: Diagnosis not present

## 2017-08-09 ENCOUNTER — Other Ambulatory Visit: Payer: Self-pay | Admitting: Orthopedic Surgery

## 2017-08-09 DIAGNOSIS — M9904 Segmental and somatic dysfunction of sacral region: Secondary | ICD-10-CM

## 2017-09-05 ENCOUNTER — Ambulatory Visit
Admission: RE | Admit: 2017-09-05 | Discharge: 2017-09-05 | Disposition: A | Discharge status: Home / Self Care | Payer: Medicare Other | Source: Ambulatory Visit | Attending: Orthopedic Surgery | Admitting: Orthopedic Surgery

## 2017-09-05 DIAGNOSIS — M9904 Segmental and somatic dysfunction of sacral region: Secondary | ICD-10-CM | POA: Diagnosis not present

## 2017-09-18 DIAGNOSIS — M9904 Segmental and somatic dysfunction of sacral region: Secondary | ICD-10-CM | POA: Diagnosis not present

## 2017-09-18 DIAGNOSIS — M5136 Other intervertebral disc degeneration, lumbar region: Secondary | ICD-10-CM | POA: Diagnosis not present

## 2017-09-18 DIAGNOSIS — M545 Low back pain: Secondary | ICD-10-CM | POA: Diagnosis not present

## 2017-09-20 DIAGNOSIS — M5136 Other intervertebral disc degeneration, lumbar region: Secondary | ICD-10-CM | POA: Diagnosis not present

## 2017-09-20 DIAGNOSIS — M5416 Radiculopathy, lumbar region: Secondary | ICD-10-CM | POA: Diagnosis not present

## 2017-11-27 DIAGNOSIS — Z0000 Encounter for general adult medical examination without abnormal findings: Secondary | ICD-10-CM | POA: Diagnosis not present

## 2017-11-27 DIAGNOSIS — G5603 Carpal tunnel syndrome, bilateral upper limbs: Secondary | ICD-10-CM | POA: Diagnosis not present

## 2017-11-27 DIAGNOSIS — I10 Essential (primary) hypertension: Secondary | ICD-10-CM | POA: Diagnosis not present

## 2017-11-27 DIAGNOSIS — M797 Fibromyalgia: Secondary | ICD-10-CM | POA: Diagnosis not present

## 2017-11-27 DIAGNOSIS — E11610 Type 2 diabetes mellitus with diabetic neuropathic arthropathy: Secondary | ICD-10-CM | POA: Diagnosis not present

## 2017-11-27 DIAGNOSIS — Z1389 Encounter for screening for other disorder: Secondary | ICD-10-CM | POA: Diagnosis not present

## 2017-12-25 DIAGNOSIS — M810 Age-related osteoporosis without current pathological fracture: Secondary | ICD-10-CM | POA: Diagnosis not present

## 2017-12-25 DIAGNOSIS — Z1231 Encounter for screening mammogram for malignant neoplasm of breast: Secondary | ICD-10-CM | POA: Diagnosis not present

## 2018-01-26 DIAGNOSIS — L309 Dermatitis, unspecified: Secondary | ICD-10-CM | POA: Diagnosis not present

## 2018-03-01 DIAGNOSIS — L309 Dermatitis, unspecified: Secondary | ICD-10-CM | POA: Diagnosis not present

## 2018-03-01 DIAGNOSIS — Z1211 Encounter for screening for malignant neoplasm of colon: Secondary | ICD-10-CM | POA: Diagnosis not present

## 2018-03-01 DIAGNOSIS — E118 Type 2 diabetes mellitus with unspecified complications: Secondary | ICD-10-CM | POA: Diagnosis not present

## 2018-03-01 DIAGNOSIS — R159 Full incontinence of feces: Secondary | ICD-10-CM | POA: Diagnosis not present

## 2018-03-26 DIAGNOSIS — L299 Pruritus, unspecified: Secondary | ICD-10-CM | POA: Diagnosis not present

## 2018-03-26 DIAGNOSIS — M3210 Systemic lupus erythematosus, organ or system involvement unspecified: Secondary | ICD-10-CM | POA: Diagnosis not present

## 2018-03-26 DIAGNOSIS — L280 Lichen simplex chronicus: Secondary | ICD-10-CM | POA: Diagnosis not present

## 2018-03-26 DIAGNOSIS — L309 Dermatitis, unspecified: Secondary | ICD-10-CM | POA: Diagnosis not present

## 2018-03-26 DIAGNOSIS — D485 Neoplasm of uncertain behavior of skin: Secondary | ICD-10-CM | POA: Diagnosis not present

## 2018-05-10 DIAGNOSIS — S99921A Unspecified injury of right foot, initial encounter: Secondary | ICD-10-CM | POA: Diagnosis not present

## 2018-05-10 DIAGNOSIS — S3993XA Unspecified injury of pelvis, initial encounter: Secondary | ICD-10-CM | POA: Diagnosis not present

## 2018-05-10 DIAGNOSIS — S8992XA Unspecified injury of left lower leg, initial encounter: Secondary | ICD-10-CM | POA: Diagnosis not present

## 2018-05-10 DIAGNOSIS — M19071 Primary osteoarthritis, right ankle and foot: Secondary | ICD-10-CM | POA: Diagnosis not present

## 2018-05-10 DIAGNOSIS — Z885 Allergy status to narcotic agent status: Secondary | ICD-10-CM | POA: Diagnosis not present

## 2018-05-10 DIAGNOSIS — S79912A Unspecified injury of left hip, initial encounter: Secondary | ICD-10-CM | POA: Diagnosis not present

## 2018-05-10 DIAGNOSIS — S62336A Displaced fracture of neck of fifth metacarpal bone, right hand, initial encounter for closed fracture: Secondary | ICD-10-CM | POA: Diagnosis not present

## 2018-05-10 DIAGNOSIS — S098XXA Other specified injuries of head, initial encounter: Secondary | ICD-10-CM | POA: Diagnosis not present

## 2018-05-10 DIAGNOSIS — R609 Edema, unspecified: Secondary | ICD-10-CM | POA: Diagnosis not present

## 2018-05-10 DIAGNOSIS — M85841 Other specified disorders of bone density and structure, right hand: Secondary | ICD-10-CM | POA: Diagnosis not present

## 2018-05-10 DIAGNOSIS — R590 Localized enlarged lymph nodes: Secondary | ICD-10-CM | POA: Diagnosis not present

## 2018-05-10 DIAGNOSIS — S199XXA Unspecified injury of neck, initial encounter: Secondary | ICD-10-CM | POA: Diagnosis not present

## 2018-05-10 DIAGNOSIS — S8991XA Unspecified injury of right lower leg, initial encounter: Secondary | ICD-10-CM | POA: Diagnosis not present

## 2018-05-10 DIAGNOSIS — S0093XA Contusion of unspecified part of head, initial encounter: Secondary | ICD-10-CM | POA: Diagnosis not present

## 2018-05-10 DIAGNOSIS — M47812 Spondylosis without myelopathy or radiculopathy, cervical region: Secondary | ICD-10-CM | POA: Diagnosis not present

## 2018-05-10 DIAGNOSIS — S0993XA Unspecified injury of face, initial encounter: Secondary | ICD-10-CM | POA: Diagnosis not present

## 2018-05-10 DIAGNOSIS — M85862 Other specified disorders of bone density and structure, left lower leg: Secondary | ICD-10-CM | POA: Diagnosis not present

## 2018-05-10 DIAGNOSIS — Z888 Allergy status to other drugs, medicaments and biological substances status: Secondary | ICD-10-CM | POA: Diagnosis not present

## 2018-05-10 DIAGNOSIS — Z981 Arthrodesis status: Secondary | ICD-10-CM | POA: Diagnosis not present

## 2018-05-10 DIAGNOSIS — S62304A Unspecified fracture of fourth metacarpal bone, right hand, initial encounter for closed fracture: Secondary | ICD-10-CM | POA: Diagnosis not present

## 2018-05-10 DIAGNOSIS — M8580 Other specified disorders of bone density and structure, unspecified site: Secondary | ICD-10-CM | POA: Diagnosis not present

## 2018-05-10 DIAGNOSIS — S0081XA Abrasion of other part of head, initial encounter: Secondary | ICD-10-CM | POA: Diagnosis not present

## 2018-05-10 DIAGNOSIS — Z880 Allergy status to penicillin: Secondary | ICD-10-CM | POA: Diagnosis not present

## 2018-05-10 DIAGNOSIS — S6991XA Unspecified injury of right wrist, hand and finger(s), initial encounter: Secondary | ICD-10-CM | POA: Diagnosis not present

## 2018-05-10 DIAGNOSIS — S0990XA Unspecified injury of head, initial encounter: Secondary | ICD-10-CM | POA: Diagnosis not present

## 2018-05-10 DIAGNOSIS — M85861 Other specified disorders of bone density and structure, right lower leg: Secondary | ICD-10-CM | POA: Diagnosis not present

## 2018-05-10 DIAGNOSIS — S60511A Abrasion of right hand, initial encounter: Secondary | ICD-10-CM | POA: Diagnosis not present

## 2018-05-11 DIAGNOSIS — Z0389 Encounter for observation for other suspected diseases and conditions ruled out: Secondary | ICD-10-CM | POA: Diagnosis not present

## 2018-05-11 DIAGNOSIS — M79601 Pain in right arm: Secondary | ICD-10-CM | POA: Diagnosis not present

## 2018-05-11 DIAGNOSIS — M85831 Other specified disorders of bone density and structure, right forearm: Secondary | ICD-10-CM | POA: Diagnosis not present

## 2018-05-11 DIAGNOSIS — Z880 Allergy status to penicillin: Secondary | ICD-10-CM | POA: Diagnosis not present

## 2018-05-11 DIAGNOSIS — M1811 Unilateral primary osteoarthritis of first carpometacarpal joint, right hand: Secondary | ICD-10-CM | POA: Diagnosis not present

## 2018-05-11 DIAGNOSIS — M8580 Other specified disorders of bone density and structure, unspecified site: Secondary | ICD-10-CM | POA: Diagnosis not present

## 2018-05-11 DIAGNOSIS — Z885 Allergy status to narcotic agent status: Secondary | ICD-10-CM | POA: Diagnosis not present

## 2018-05-11 DIAGNOSIS — Z888 Allergy status to other drugs, medicaments and biological substances status: Secondary | ICD-10-CM | POA: Diagnosis not present

## 2018-05-11 DIAGNOSIS — S62306A Unspecified fracture of fifth metacarpal bone, right hand, initial encounter for closed fracture: Secondary | ICD-10-CM | POA: Diagnosis not present

## 2018-05-11 DIAGNOSIS — M79631 Pain in right forearm: Secondary | ICD-10-CM | POA: Diagnosis not present

## 2018-05-11 DIAGNOSIS — S59901A Unspecified injury of right elbow, initial encounter: Secondary | ICD-10-CM | POA: Diagnosis not present

## 2018-05-11 DIAGNOSIS — S6991XA Unspecified injury of right wrist, hand and finger(s), initial encounter: Secondary | ICD-10-CM | POA: Diagnosis not present

## 2018-05-15 DIAGNOSIS — L299 Pruritus, unspecified: Secondary | ICD-10-CM | POA: Diagnosis not present

## 2018-05-15 DIAGNOSIS — L280 Lichen simplex chronicus: Secondary | ICD-10-CM | POA: Diagnosis not present

## 2018-05-15 DIAGNOSIS — L0100 Impetigo, unspecified: Secondary | ICD-10-CM | POA: Diagnosis not present

## 2018-05-16 DIAGNOSIS — S62336A Displaced fracture of neck of fifth metacarpal bone, right hand, initial encounter for closed fracture: Secondary | ICD-10-CM | POA: Diagnosis not present

## 2018-05-16 DIAGNOSIS — M797 Fibromyalgia: Secondary | ICD-10-CM | POA: Diagnosis not present

## 2018-05-16 DIAGNOSIS — E1165 Type 2 diabetes mellitus with hyperglycemia: Secondary | ICD-10-CM | POA: Diagnosis not present

## 2018-05-16 DIAGNOSIS — W010XXA Fall on same level from slipping, tripping and stumbling without subsequent striking against object, initial encounter: Secondary | ICD-10-CM | POA: Diagnosis not present

## 2018-05-25 DIAGNOSIS — L309 Dermatitis, unspecified: Secondary | ICD-10-CM | POA: Diagnosis not present

## 2018-05-25 DIAGNOSIS — E118 Type 2 diabetes mellitus with unspecified complications: Secondary | ICD-10-CM | POA: Diagnosis not present

## 2018-06-11 DIAGNOSIS — L659 Nonscarring hair loss, unspecified: Secondary | ICD-10-CM | POA: Diagnosis not present

## 2018-12-10 IMAGING — CT CT BIOPSY
2 of 3 series · 13 of 32 positions shown, 19 images · non-contrast
Comparison: none

CLINICAL DATA: Left posterior pelvis pain.

[Series 3: needle -guided injection · axial · 0.91mm/px · z∈[-414,-374]mm · 9 of 26 slices shown, 15 images (1 of 2)]
[im 3/26  soft-tissue]
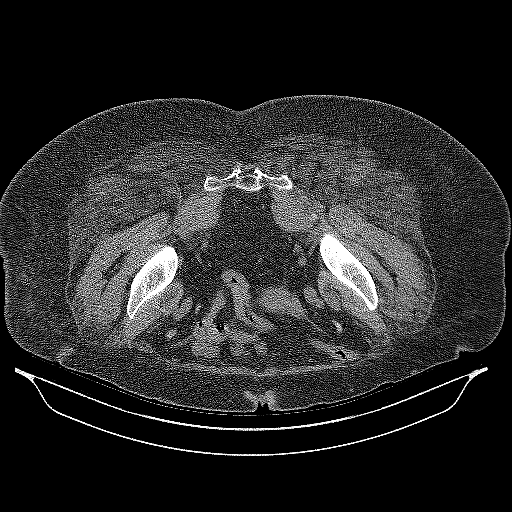
[im 3/26  bone]
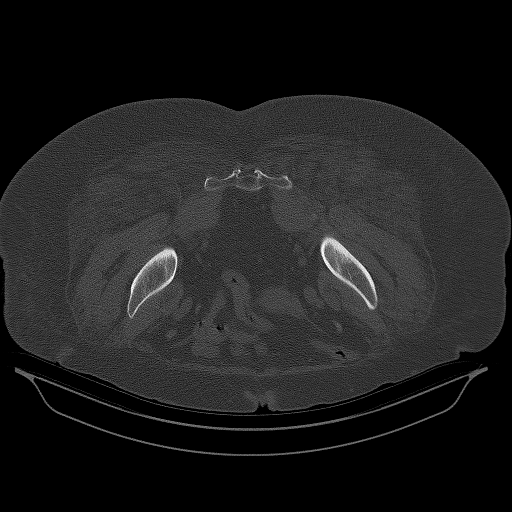
[im 6/26  soft-tissue]
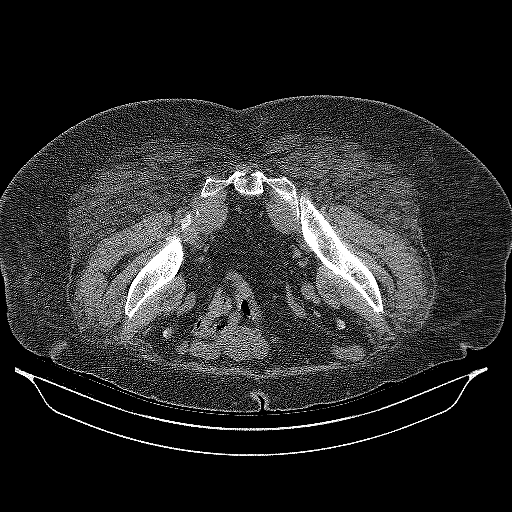
[im 8/26  soft-tissue]
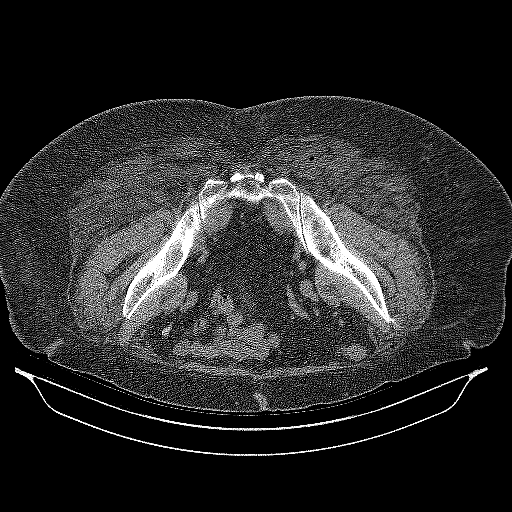
[im 11/26  soft-tissue]
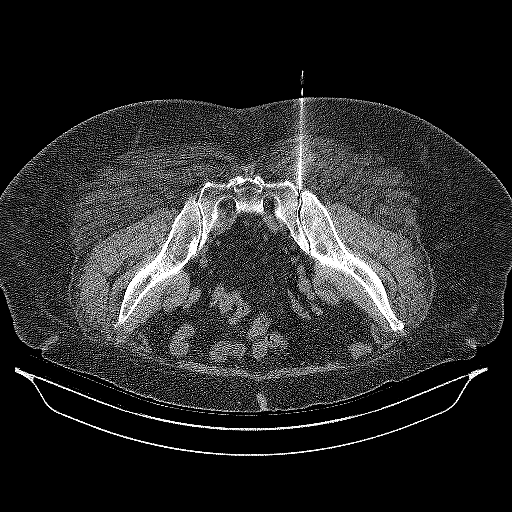
[im 13/26  soft-tissue]
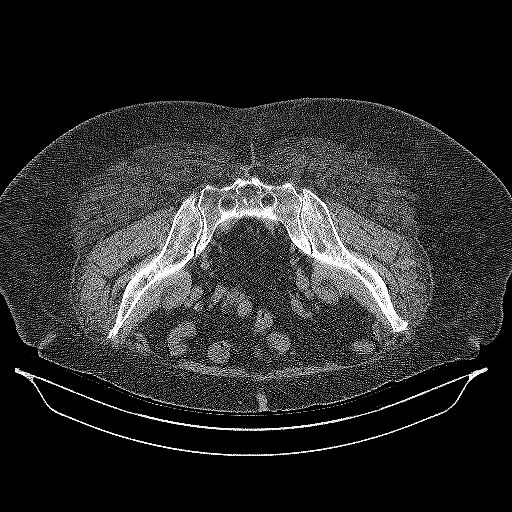
[im 16/26  soft-tissue]
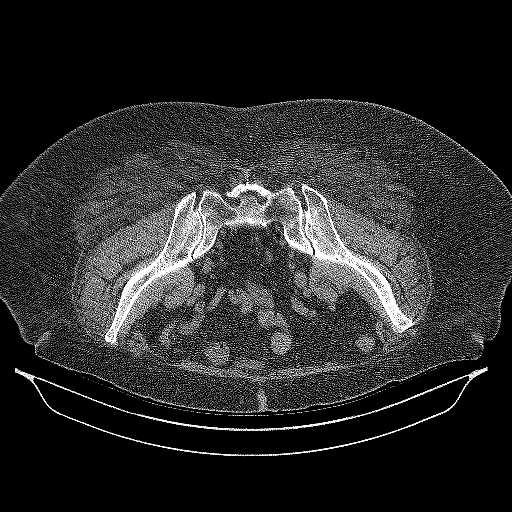
[im 16/26  lung]
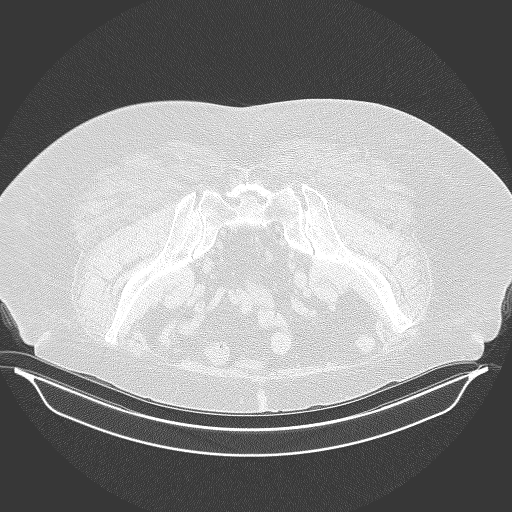
[im 18/26  soft-tissue]
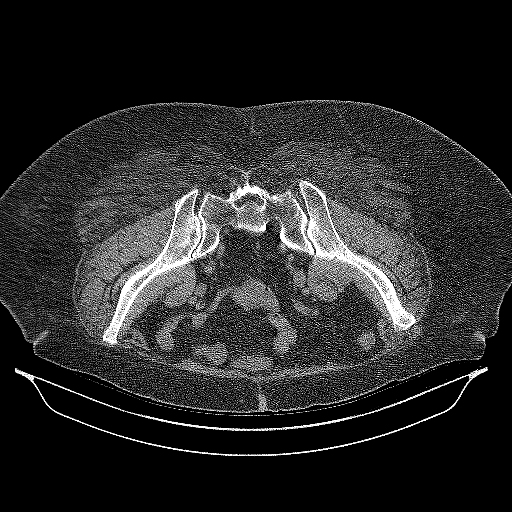
[im 18/26  lung]
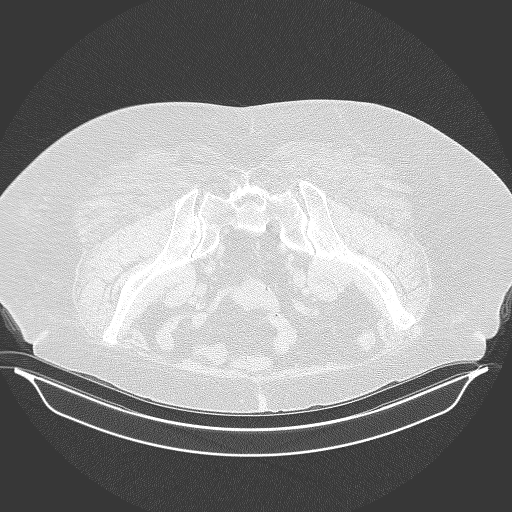
[im 21/26  soft-tissue]
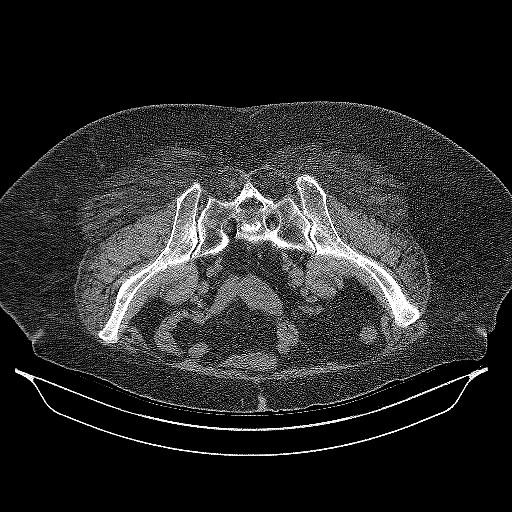
[im 21/26  lung]
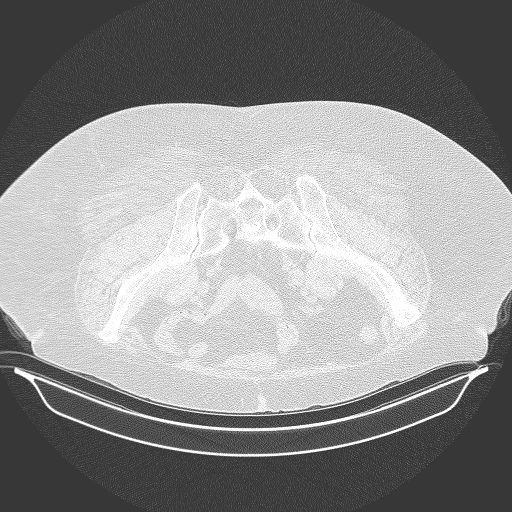
[im 23/26  soft-tissue]
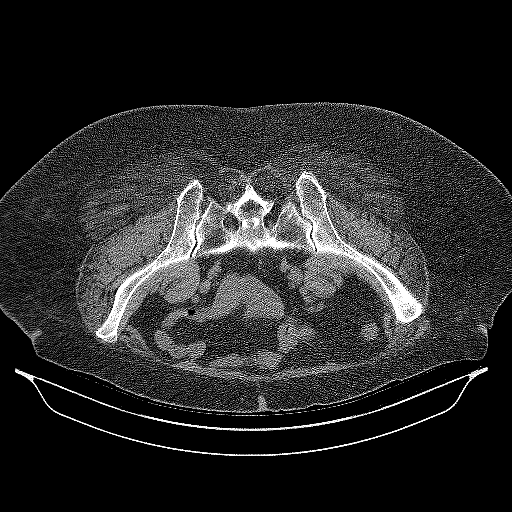
[im 23/26  lung]
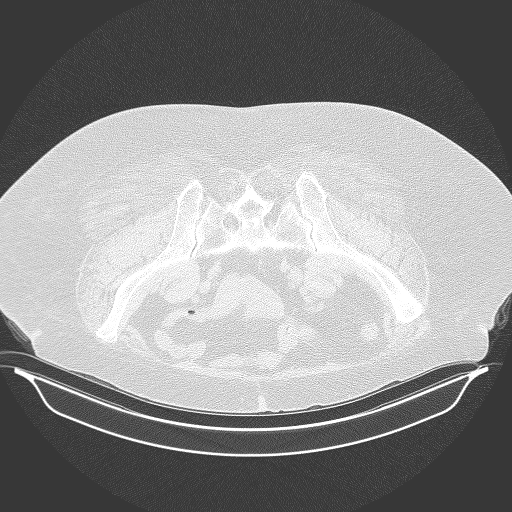
[im 23/26  bone]
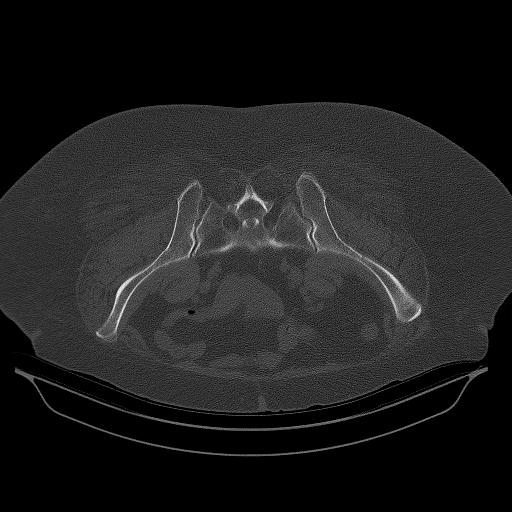

[Series 4: needle -guided injection · axial · 0.91mm/px · z∈[-414,-398]mm · 4 of 26 slices shown (2 of 2)]
[im 3/26  soft-tissue]
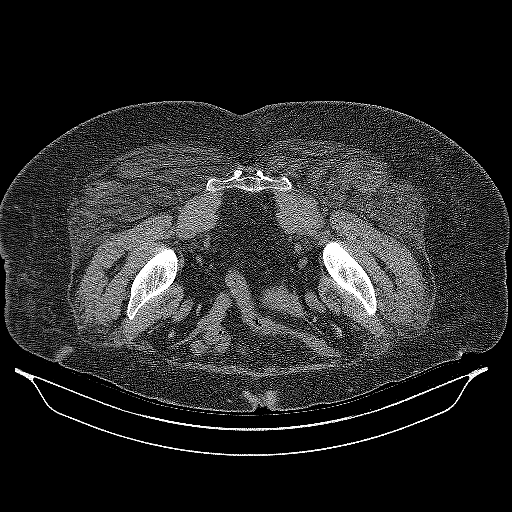
[im 6/26  soft-tissue]
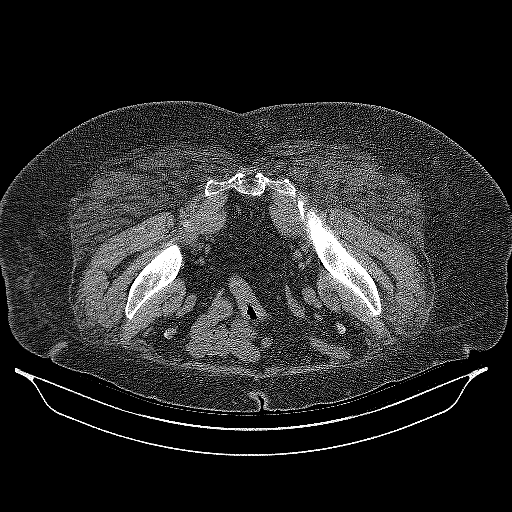
[im 8/26  soft-tissue]
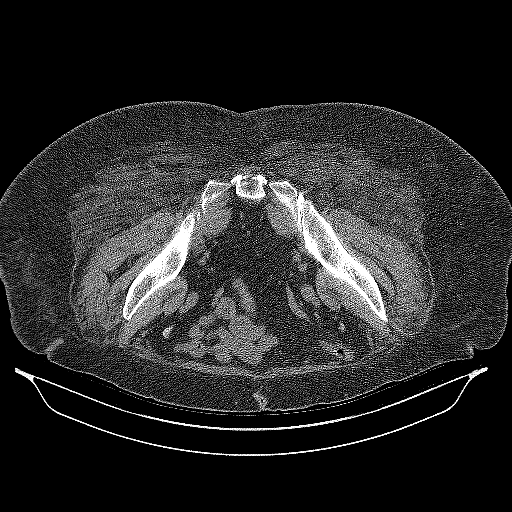
[im 11/26  soft-tissue]
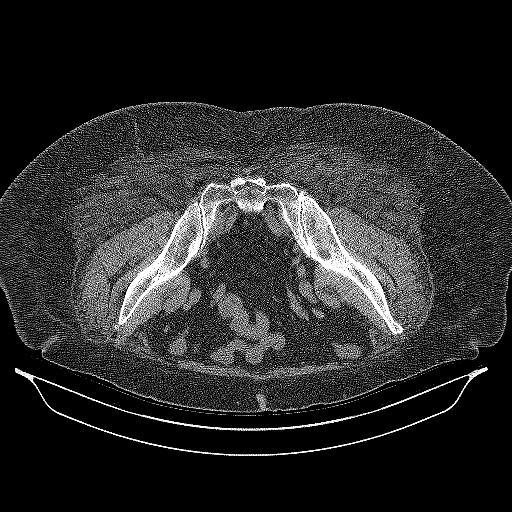

[13 of 32 positions shown; findings below may reference images not displayed]

EXAM:
Left CT GUIDED SI JOINT INJECTION



After local anesthesia with 1% lidocaine without epinephrine and
subsequent deep anesthesia, a 22 gauge spinal needle was advanced
into the left SI joint under intermittent CT guidance.

Once the needle was in satisfactory position, representative image
was captured with the needle demonstrated in the sacroiliac joint.
Subsequently, 2.5 mL 1% lidocaine was injected into the left SI
joint. Needles removed and a sterile dressing applied.

No complications were observed.

The patient reported some improvement in her pain immediately
following the procedure.
IMPRESSION: Successful CT-guided left anesthetic only SI joint injection.

## 2019-07-15 DIAGNOSIS — I10 Essential (primary) hypertension: Secondary | ICD-10-CM | POA: Diagnosis not present

## 2019-07-15 DIAGNOSIS — F325 Major depressive disorder, single episode, in full remission: Secondary | ICD-10-CM | POA: Diagnosis not present

## 2019-07-15 DIAGNOSIS — E1165 Type 2 diabetes mellitus with hyperglycemia: Secondary | ICD-10-CM | POA: Diagnosis not present

## 2019-09-25 DIAGNOSIS — I10 Essential (primary) hypertension: Secondary | ICD-10-CM | POA: Diagnosis not present

## 2019-09-25 DIAGNOSIS — F325 Major depressive disorder, single episode, in full remission: Secondary | ICD-10-CM | POA: Diagnosis not present

## 2019-09-25 DIAGNOSIS — M1909 Primary osteoarthritis, other specified site: Secondary | ICD-10-CM | POA: Diagnosis not present

## 2019-09-25 DIAGNOSIS — E1165 Type 2 diabetes mellitus with hyperglycemia: Secondary | ICD-10-CM | POA: Diagnosis not present

## 2019-11-13 DIAGNOSIS — R0781 Pleurodynia: Secondary | ICD-10-CM | POA: Diagnosis not present

## 2019-11-13 DIAGNOSIS — S299XXA Unspecified injury of thorax, initial encounter: Secondary | ICD-10-CM | POA: Diagnosis not present

## 2019-11-13 DIAGNOSIS — Z880 Allergy status to penicillin: Secondary | ICD-10-CM | POA: Diagnosis not present

## 2019-11-13 DIAGNOSIS — Z885 Allergy status to narcotic agent status: Secondary | ICD-10-CM | POA: Diagnosis not present

## 2019-11-13 DIAGNOSIS — E669 Obesity, unspecified: Secondary | ICD-10-CM | POA: Diagnosis not present

## 2019-11-13 DIAGNOSIS — Z886 Allergy status to analgesic agent status: Secondary | ICD-10-CM | POA: Diagnosis not present

## 2019-11-13 DIAGNOSIS — E119 Type 2 diabetes mellitus without complications: Secondary | ICD-10-CM | POA: Diagnosis not present

## 2019-11-13 DIAGNOSIS — S20211A Contusion of right front wall of thorax, initial encounter: Secondary | ICD-10-CM | POA: Diagnosis not present

## 2019-11-13 DIAGNOSIS — Z888 Allergy status to other drugs, medicaments and biological substances status: Secondary | ICD-10-CM | POA: Diagnosis not present

## 2019-12-20 DIAGNOSIS — Z23 Encounter for immunization: Secondary | ICD-10-CM | POA: Diagnosis not present

## 2020-01-03 DIAGNOSIS — E1165 Type 2 diabetes mellitus with hyperglycemia: Secondary | ICD-10-CM | POA: Diagnosis not present

## 2020-01-10 DIAGNOSIS — Z23 Encounter for immunization: Secondary | ICD-10-CM | POA: Diagnosis not present

## 2020-04-20 DIAGNOSIS — Z888 Allergy status to other drugs, medicaments and biological substances status: Secondary | ICD-10-CM | POA: Diagnosis not present

## 2020-04-20 DIAGNOSIS — Z91018 Allergy to other foods: Secondary | ICD-10-CM | POA: Diagnosis not present

## 2020-04-20 DIAGNOSIS — Z981 Arthrodesis status: Secondary | ICD-10-CM | POA: Diagnosis not present

## 2020-04-20 DIAGNOSIS — S39012A Strain of muscle, fascia and tendon of lower back, initial encounter: Secondary | ICD-10-CM | POA: Diagnosis not present

## 2020-04-20 DIAGNOSIS — E119 Type 2 diabetes mellitus without complications: Secondary | ICD-10-CM | POA: Diagnosis not present

## 2020-04-20 DIAGNOSIS — M5125 Other intervertebral disc displacement, thoracolumbar region: Secondary | ICD-10-CM | POA: Diagnosis not present

## 2020-04-20 DIAGNOSIS — M5126 Other intervertebral disc displacement, lumbar region: Secondary | ICD-10-CM | POA: Diagnosis not present

## 2020-04-20 DIAGNOSIS — M47816 Spondylosis without myelopathy or radiculopathy, lumbar region: Secondary | ICD-10-CM | POA: Diagnosis not present

## 2020-04-20 DIAGNOSIS — M48061 Spinal stenosis, lumbar region without neurogenic claudication: Secondary | ICD-10-CM | POA: Diagnosis not present

## 2020-04-20 DIAGNOSIS — M797 Fibromyalgia: Secondary | ICD-10-CM | POA: Diagnosis not present

## 2020-04-20 DIAGNOSIS — M545 Low back pain, unspecified: Secondary | ICD-10-CM | POA: Diagnosis not present

## 2020-04-20 DIAGNOSIS — M2578 Osteophyte, vertebrae: Secondary | ICD-10-CM | POA: Diagnosis not present

## 2020-04-20 DIAGNOSIS — Z880 Allergy status to penicillin: Secondary | ICD-10-CM | POA: Diagnosis not present

## 2020-04-20 DIAGNOSIS — Z885 Allergy status to narcotic agent status: Secondary | ICD-10-CM | POA: Diagnosis not present

## 2020-04-20 DIAGNOSIS — M4805 Spinal stenosis, thoracolumbar region: Secondary | ICD-10-CM | POA: Diagnosis not present

## 2020-04-20 DIAGNOSIS — S3992XA Unspecified injury of lower back, initial encounter: Secondary | ICD-10-CM | POA: Diagnosis not present

## 2020-04-20 DIAGNOSIS — M810 Age-related osteoporosis without current pathological fracture: Secondary | ICD-10-CM | POA: Diagnosis not present

## 2023-02-07 ENCOUNTER — Ambulatory Visit: Payer: Medicare PPO | Admitting: Dermatology

## 2023-02-07 DIAGNOSIS — L308 Other specified dermatitis: Secondary | ICD-10-CM

## 2023-02-07 DIAGNOSIS — Z79899 Other long term (current) drug therapy: Secondary | ICD-10-CM

## 2023-02-07 DIAGNOSIS — R21 Rash and other nonspecific skin eruption: Secondary | ICD-10-CM | POA: Diagnosis not present

## 2023-02-07 DIAGNOSIS — L299 Pruritus, unspecified: Secondary | ICD-10-CM

## 2023-02-07 MED ORDER — HYDROCORTISONE 2.5 % EX OINT: TOPICAL_OINTMENT | Freq: Two times a day (BID) | CUTANEOUS | 2 refills | Status: AC

## 2023-02-07 MED ORDER — KETOCONAZOLE 2 % EX SHAM: 1.0000 | MEDICATED_SHAMPOO | Freq: Once | CUTANEOUS | 2 refills | Status: AC

## 2023-02-07 MED ORDER — PREDNISONE 5 MG PO TABS: 5.0000 mg | ORAL_TABLET | Freq: Every day | ORAL | 0 refills | Status: DC

## 2023-02-07 MED ORDER — DUPIXENT 300 MG/2ML ~~LOC~~ SOAJ: 300.0000 mg | SUBCUTANEOUS | 5 refills | Status: AC

## 2023-02-07 MED ORDER — CLOBETASOL PROPIONATE 0.05 % EX SOLN: 1.0000 | Freq: Two times a day (BID) | CUTANEOUS | 2 refills | Status: AC

## 2023-02-07 MED ORDER — FLUOCINOLONE ACETONIDE SCALP 0.01 % EX OIL: TOPICAL_OIL | CUTANEOUS | 2 refills | Status: AC

## 2023-02-07 MED ORDER — TRIAMCINOLONE ACETONIDE 0.1 % EX OINT: 1.0000 | TOPICAL_OINTMENT | Freq: Two times a day (BID) | CUTANEOUS | 2 refills | Status: AC

## 2023-02-07 MED ORDER — DUPILUMAB 300 MG/2ML ~~LOC~~ SOSY
600.0000 mg | PREFILLED_SYRINGE | Freq: Once | SUBCUTANEOUS | Status: AC
Start: 2023-02-07 — End: 2023-02-07
  Administered 2023-02-07: 600 mg via SUBCUTANEOUS

## 2023-02-07 NOTE — Progress Notes (Signed)
New Patient Visit   Subjective  Cheyenne Welch is a 62 y.o. female who presents for the following: Rash  At face, arms, chest, back, neck. Itching. Rash started a few months ago and has gotten progressively worse. Patient did have a prednisone injection but only had relief for about an hour, she was not given anything topical. No hx of asthma or allergies. No new medications.  No recent onset of muscle weakness, patient has had spine surgery, needs 2 new hips, needs cane to walk.  Uses homemade soap and dial.  Takes sponge baths with bottled water because her running water is contaminated.  The following portions of the chart were reviewed this encounter and updated as appropriate: medications, allergies, medical history  Review of Systems:  No other skin or systemic complaints except as noted in HPI or Assessment and Plan.  Objective  Well appearing patient in no apparent distress; mood and affect are within normal limits.  A focused examination was performed of the following areas: Face, arms, back, chest  Relevant exam findings are noted in the Assessment and Plan.  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 Condition is bothersome/symptomatic for patient. Currently flared.  Treatment Plan:  Skin punch biopsies today x 2, pt instructed to remove sutures at an Urgent Care in 1 week (she lives 2 hrs away) pending results, may check ANA panel  Start prednisone 3 week taper. Take prednisone by mouth daily in morning with food starting at 60 mg dosage and gradually decreasing daily dose as directed until gone for a three week taper.  Patient was given written instructions.   Potential side effects reviewed.  Risks of prednisone taper include mood irritability, insomnia, weight gain, stomach ulcers, increased risk of infection, increased blood sugar (diabetes), hypertension, osteoporosis with long-term or frequent use, and rare risk of avascular necrosis of the hip.    Apply TMC 0.1% ointment to AA body BID. 1 lb jar Avoid applying to face, groin, and axilla. Use as directed. Long-term use can cause thinning of the skin. Apply HC 2.5% oint to AA face, ears BID. 60 gm  Apply clobetasol solution 1-2 times daily to AA scalp PRN itch. Avoid applying to face, groin, and axilla. Use as directed. Long-term use can cause thinning of the skin.  Apply fluocinolone scalp oil to scalp three times weekly at bedtime, cover with shower cap and leave on overnight. In the am, massage 2% ketoconazole shampoo into scalp, let it sit a few minutes before rinsing.  Topical steroids (such as triamcinolone, fluocinolone, fluocinonide, mometasone, clobetasol, halobetasol, betamethasone, hydrocortisone) can cause thinning and lightening of the skin if they are used for too long in the same area. Your physician has selected the right strength medicine for your problem and area affected on the body. Please use your medication only as directed by your physician to prevent side effects.     Start Dupixent 600 mg/54mL today. Will send in Rx to [facility] Loading dose injected today to b/l upper arms.  [facility] 1610-9604-54 Lot # [id number] Exp:10/2024  Patient will do injection at home in 2 weeks and at 4 weeks Sample x 2 (1 box) given to  patient to take home. [facility] 7829-5621-30 Lot # [id number]  Exp: 12/06/2024  Dupilumab (Dupixent) is a treatment given by injection for adults and children with moderate-to-severe atopic dermatitis. Goal is control of skin condition, not cure. It is given as 2 injections at the first dose followed by 1 injection ever 2 weeks thereafter.  Young children are dosed  monthly.  Potential side effects include allergic reaction, herpes infections, injection site reactions and conjunctivitis (inflammation of the eyes).  The use of Dupixent requires long term medication management, including periodic office visits.   Skin / nail biopsy - left arm Type of biopsy: punch   Informed consent: discussed and consent obtained   Timeout: patient name, date of birth, surgical site, and procedure verified   Procedure prep:  Patient was prepped and draped in usual sterile fashion Prep type:  Isopropyl alcohol Anesthesia: the lesion was anesthetized in a standard fashion   Anesthetic:  1% lidocaine w/ epinephrine 1-100,000 buffered w/ 8.4% NaHCO3 Punch size:  3.5 mm Suture size:  4-0 Suture type: nylon   Suture removal (days):  7 Hemostasis achieved with: suture   Outcome: patient tolerated procedure well   Post-procedure details: wound care instructions given   Additional details:  Petrolatum and a pressure dressing were applied  Skin / nail biopsy - left lower neck Type of biopsy: punch   Informed consent: discussed and consent obtained   Timeout: patient name, date of birth, surgical site, and procedure verified   Procedure prep:  Patient was prepped and draped in usual sterile fashion Prep type:  Isopropyl alcohol Anesthesia: the lesion was anesthetized in a standard fashion   Anesthetic:  1% lidocaine w/ epinephrine 1-100,000 buffered w/ 8.4% NaHCO3 Punch size:  3.5 mm Suture size:  4-0 Suture type: nylon   Suture removal (days):  7 Hemostasis achieved with: suture   Outcome: patient tolerated procedure well   Post-procedure details: wound care instructions given   Additional details:  Petrolatum and a pressure dressing were applied  Specimen 1 - Surgical pathology Differential Diagnosis: Dermatitis w/LSC vs Dermatomyositis vs PRP  Check Margins: No pink scaly patches at back, chest, arms, scalp, breast, face   Specimen 2 - Surgical  pathology Differential Diagnosis: Dermatitis w/LSC vs Dermatomyositis vs PRP  Check Margins: No pink scaly patches at back, chest, arms, scalp, breast, face    Related Medications dupilumab (DUPIXENT) prefilled syringe 600 mg   Long-term use of high-risk medication  Pruritus   Return in about 4 weeks (around 03/07/2023) for Rash.  Anise Salvo, RMA, am acting as scribe for Willeen Niece, MD .   Documentation: I have reviewed the above documentation for accuracy and completeness, and I agree with the above.  Willeen Niece, MD

## 2023-02-07 NOTE — Patient Instructions (Addendum)
3 Week Prednisone Taper  You will be given a prescription for 150 tablets of oral Prednisone. It is very important that you take this according to the exact schedule provided below. This type of regimen for taking medication is often called a "taper", because your dosage will steadily decrease over a two week period until it is discontinued altogether.  ALWAYS take this medicine with food to prevent it from irritating your stomach. You should also take your Prednisone during morning hours.  Call the clinic at [phone] if you gain more than two pounds in one day, notice swelling anywhere on your body, have shortness of breath, black or red bowel movements, brown or red vomitus, desire to drink large amounts of fluids, a fever, or extreme weakness.   Oral Prednisone over Three Weeks  Day  Week 1  Week 2  Week 3   1  12  tablets  9 tablets  5 tablets   2  12 tablets  8 tablets  5 tablets   3  11 tablets  8 tablets  4 tablets   4  11 tablets  7 tablets  4 tablets   5  10 tablets  7 tablets  3 tablets   6  10 tablets  6 tablets  2 tablets   7  9 tablets  6 tablets  1 tablet   Risks of prednisone taper include mood irritability, insomnia, weight gain, stomach ulcers, increased risk of infection, increased blood sugar (diabetes), hypertension, osteoporosis with long-term or frequent use, and rare risk of avascular necrosis of the hip.   Dupilumab (Dupixent) is a treatment given by injection for adults and children with moderate-to-severe atopic dermatitis. Goal is control of skin condition, not cure. It is given as 2 injections at the first dose followed by 1 injection ever 2 weeks thereafter.  Young children are dosed monthly.  Potential side effects include allergic reaction, herpes infections, injection site reactions and conjunctivitis (inflammation of the eyes).  The use of Dupixent requires long term medication management, including periodic office visits.  Treatment Plan: Start  prednisone 3 week taper. Apply triamcinolone 0.1% ointment to affected areas of body twice daily. Avoid applying to face, groin, and axilla. Use as directed. Long-term use can cause thinning of the skin. Apply hydrocortisone 2.5% oint to affected areas of face, ears twice daily.  Apply clobetasol solution 1-2 times daily to affected areas scalp as needed for itch. Avoid applying to face, groin, and axilla. Use as directed. Long-term use can cause thinning of the skin. Apply fluocinolone scalp oil to scalp three times weekly at bedtime, cover with shower cap and leave on overnight. In the am, massage ketoconazole shampoo into scalp, let it sit a few minutes before rinsing.   Wound Care Instructions  Cleanse wound gently with soap and water once a day then pat dry with clean gauze. Apply a thin coat of Petrolatum (petroleum jelly, "Vaseline") over the wound (unless you have an allergy to this). We recommend that you use a new, sterile tube of Vaseline. Do not pick or remove scabs. Do not remove the yellow or white "healing tissue" from the base of the wound.  Cover the wound with fresh, clean, nonstick gauze and secure with paper tape. You may use Band-Aids in place of gauze and tape if the wound is small enough, but would recommend trimming much of the tape off as there is often too much. Sometimes Band-Aids can irritate the skin.  You should call the office  for your biopsy report after 1 week if you have not already been contacted.  If you experience any problems, such as abnormal amounts of bleeding, swelling, significant bruising, significant pain, or evidence of infection, please call the office immediately.  FOR ADULT SURGERY PATIENTS: If you need something for pain relief you may take 1 extra strength Tylenol (acetaminophen) AND 2 Ibuprofen (200mg  each) together every 4 hours as needed for pain. (do not take these if you are allergic to them or if you have a reason you should not take them.)  Typically, you may only need pain medication for 1 to 3 days.   Suture removal in 1 week. Dupixent 300 mg/11mL injected in 2 weeks.   Due to recent changes in healthcare laws, you may see results of your pathology and/or laboratory studies on MyChart before the doctors have had a chance to review them. We understand that in some cases there may be results that are confusing or concerning to you. Please understand that not all results are received at the same time and often the doctors may need to interpret multiple results in order to provide you with the best plan of care or course of treatment. Therefore, we ask that you please give [place] 2 business days to thoroughly review all your results before contacting the office for clarification. Should we see a critical lab result, you will be contacted sooner.   If You Need Anything After Your Visit  If you have any questions or concerns for your doctor, please call our main line at [phone] and press option 4 to reach your doctor's medical assistant. If no one answers, please leave a voicemail as directed and we will return your call as soon as possible. Messages left after 4 pm will be answered the following business day.   You may also send [place] a message via MyChart. We typically respond to MyChart messages within 1-2 business days.  For prescription refills, please ask your pharmacy to contact our office. Our fax number is [phone].  If you have an urgent issue when the clinic is closed that cannot wait until the next business day, you can page your doctor at the number below.    Please note that while we do our best to be available for urgent issues outside of office hours, we are not available 24/7.   If you have an urgent issue and are unable to reach [place], you may choose to seek medical care at your doctor's office, retail clinic, urgent care center, or emergency room.  If you have a medical emergency, please immediately call 911 or go to  the emergency department.  Pager Numbers  - Dr. Gwen Pounds: [phone]  - Dr. Roseanne Reno: [phone]  - Dr. Katrinka Blazing: [phone]   In the event of inclement weather, please call our main line at [phone] for an update on the status of any delays or closures.  Dermatology Medication Tips: Please keep the boxes that topical medications come in in order to help keep track of the instructions about where and how to use these. Pharmacies typically print the medication instructions only on the boxes and not directly on the medication tubes.   If your medication is too expensive, please contact our office at [phone] option 4 or send [place] a message through MyChart.   We are unable to tell what your co-pay for medications will be in advance as this is different depending on your insurance coverage. However, we may be able to find a  substitute medication at lower cost or fill out paperwork to get insurance to cover a needed medication.   If a prior authorization is required to get your medication covered by your insurance company, please allow [place] 1-2 business days to complete this process.  Drug prices often vary depending on where the prescription is filled and some pharmacies may offer cheaper prices.  The website www.goodrx.com contains coupons for medications through different pharmacies. The prices here do not account for what the cost may be with help from insurance (it may be cheaper with your insurance), but the website can give you the price if you did not use any insurance.  - You can print the associated coupon and take it with your prescription to the pharmacy.  - You may also stop by our office during regular business hours and pick up a GoodRx coupon card.  - If you need your prescription sent electronically to a different pharmacy, notify our office through [facility] or by phone at [phone] option 4.     Si Usted Necesita Algo Despus de Su Visita  Tambin  puede enviarnos un mensaje a travs de Clinical cytogeneticist. Por lo general respondemos a los mensajes de MyChart en el transcurso de 1 a 2 das hbiles.  Para renovar recetas, por favor pida a su farmacia que se ponga en contacto con nuestra oficina. Annie Sable de fax es [place] [phone].  Si tiene un asunto urgente cuando la clnica est cerrada y que no puede esperar hasta el siguiente da hbil, puede llamar/localizar a su doctor(a) al nmero que aparece a continuacin.   Por favor, tenga en cuenta que aunque hacemos todo lo posible para estar disponibles para asuntos urgentes fuera del horario de [place], no estamos disponibles las 24 horas del da, los 7 [address]  [address] de la [place].   Si tiene un problema urgente y no puede comunicarse con nosotros, puede optar por buscar atencin mdica  en el consultorio de su doctor(a), en una clnica privada, en un centro de atencin urgente o en una sala de emergencias.  Si tiene Engineer, drilling, por favor llame inmediatamente al 911 o vaya a la sala de emergencias.  Nmeros de bper  - Dr. Gwen Pounds: [phone]  - Dra. Roseanne Reno: [phone]  - Dr. Katrinka Blazing: [phone]   En caso de inclemencias del tiempo, por favor llame a Lacy Duverney principal al [phone] para una actualizacin sobre el [place] de cualquier retraso o cierre.  Consejos para la medicacin en dermatologa: Por favor, guarde las cajas en las que vienen los medicamentos de uso tpico para ayudarle a seguir las instrucciones sobre dnde y cmo usarlos. Las farmacias generalmente imprimen las instrucciones del medicamento slo en las cajas y no directamente en los tubos del [place].   Si su medicamento es muy caro, por favor, pngase en contacto con Rolm Gala llamando al [phone] y presione la opcin 4 o envenos un mensaje a travs de Clinical cytogeneticist.   No podemos decirle cul ser su copago por los medicamentos por adelantado ya que esto es diferente dependiendo de la cobertura de su  seguro. Sin embargo, es posible que podamos encontrar un medicamento sustituto a Audiological scientist un formulario para que el seguro cubra el medicamento que se considera necesario.   Si se requiere una autorizacin previa para que su compaa de seguros [place] su medicamento, por favor permtanos de 1 a 2 das hbiles para completar [address]  Los precios de los medicamentos varan con frecuencia dependiendo del  lugar de dnde se surte la receta y alguna farmacias pueden ofrecer precios ms baratos.  El sitio web www.goodrx.com tiene cupones para medicamentos de Health and safety inspector. Los precios aqu no tienen en cuenta lo que podra costar con la ayuda del seguro (puede ser ms barato con su seguro), pero el sitio web puede darle el precio si no utiliz Tourist information centre manager.  - Puede imprimir el cupn correspondiente y llevarlo con su receta a la farmacia.  - Tambin puede pasar por nuestra oficina durante el horario de atencin regular y Education officer, museum una tarjeta de cupones de GoodRx.  - Si necesita que su receta se enve electrnicamente a una farmacia diferente, informe a nuestra oficina a travs de MyChart de [place] o por telfono llamando al [phone] y presione la opcin 4.

## 2023-02-08 ENCOUNTER — Other Ambulatory Visit: Payer: Self-pay

## 2023-02-08 MED ORDER — PREDNISONE 5 MG PO TABS: ORAL_TABLET | ORAL | 0 refills | Status: DC

## 2023-02-08 NOTE — Progress Notes (Signed)
Prednisone was sent in as take 1 PO every AM. Pharmacy filled based off directions and filed with insurance only allowing 90 day supply. The only way to correct this is to send in new RX (done) and would be fill middle of 3 week taper. If insurance does not approve, she will have to fill RX with GoodRX coupon.

## 2023-02-09 LAB — SURGICAL PATHOLOGY

## 2023-02-13 ENCOUNTER — Telehealth: Payer: Self-pay

## 2023-02-13 ENCOUNTER — Other Ambulatory Visit: Payer: Self-pay

## 2023-02-13 DIAGNOSIS — R21 Rash and other nonspecific skin eruption: Secondary | ICD-10-CM

## 2023-02-13 MED ORDER — PREDNISONE 5 MG PO TABS: ORAL_TABLET | ORAL | 0 refills | Status: AC

## 2023-02-13 NOTE — Telephone Encounter (Signed)
-----   Message from Willeen Niece sent at 02/13/2023  9:17 AM EDT ----- 1. Skin, left arm :       INTERFACE DERMATITIS, SEE DESCRIPTION   2. 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  Need labs- ANA panel with reflex, CK, CMP, CBC/diff, ESR, CRP, TSH  - please call patient

## 2023-02-13 NOTE — Progress Notes (Signed)
Prescription was sent to wrong pharmacy so I sent to correct pharmacy.Cheyenne Welch

## 2023-02-13 NOTE — Telephone Encounter (Signed)
Advised pt of bx results.  Advised pt I will mail her the lab requisition that she will take to a [facility] draw station.  Discussed location close to her.  Pt did not get all of the Prednisone taper that was prescribed so I sent remainder to correct pharmacy.  Patient said when she was removing sutures she noticed a little pus.  I advised to start Polysporin to area qd and cover with bandaid. Marguerite Olea

## 2023-02-21 ENCOUNTER — Telehealth: Payer: Self-pay

## 2023-02-21 NOTE — Telephone Encounter (Signed)
LM on VM please call here to discuss elevated Glucose labs-

## 2023-02-21 NOTE — Telephone Encounter (Signed)
I called patient discussed lab results Glucose elevated, she report she is Glipizide to control her diabetes, patient instructed to call PCP to discuss elevated labs.

## 2023-02-21 NOTE — Telephone Encounter (Signed)
Dr Roseanne Reno patient   [facility] calling with elevated Glucose level of 568 critical

## 2023-02-22 LAB — COMPREHENSIVE METABOLIC PANEL
ALT: 64 [IU]/L — ABNORMAL HIGH (ref 0–32)
AST: 55 [IU]/L — ABNORMAL HIGH (ref 0–40)
Albumin: 3.9 g/dL (ref 3.9–4.9)
Alkaline Phosphatase: 103 [IU]/L (ref 44–121)
BUN/Creatinine Ratio: 17 (ref 12–28)
BUN: 13 mg/dL (ref 8–27)
Bilirubin Total: 0.8 mg/dL (ref 0.0–1.2)
CO2: 21 mmol/L (ref 20–29)
Calcium: 9.2 mg/dL (ref 8.7–10.3)
Chloride: 98 mmol/L (ref 96–106)
Creatinine, Ser: 0.78 mg/dL (ref 0.57–1.00)
Globulin, Total: 2.3 g/dL (ref 1.5–4.5)
Glucose: 568 mg/dL (ref 70–99)
Potassium: 3.7 mmol/L (ref 3.5–5.2)
Sodium: 138 mmol/L (ref 134–144)
Total Protein: 6.2 g/dL (ref 6.0–8.5)
eGFR: 85 mL/min/{1.73_m2} (ref >59)

## 2023-02-22 LAB — CBC WITH DIFFERENTIAL/PLATELET
Basophils Absolute: 0 10*3/uL (ref 0.0–0.2)
Basos: 0 %
EOS (ABSOLUTE): 0 10*3/uL (ref 0.0–0.4)
Eos: 0 %
Hematocrit: 45.4 % (ref 34.0–46.6)
Hemoglobin: 14.8 g/dL (ref 11.1–15.9)
Immature Grans (Abs): 0 10*3/uL (ref 0.0–0.1)
Immature Granulocytes: 1 %
Lymphocytes Absolute: 1 10*3/uL (ref 0.7–3.1)
Lymphs: 13 %
MCH: 31.6 pg (ref 26.6–33.0)
MCHC: 32.6 g/dL (ref 31.5–35.7)
MCV: 97 fL (ref 79–97)
Monocytes Absolute: 0.6 10*3/uL (ref 0.1–0.9)
Monocytes: 8 %
Neutrophils Absolute: 5.8 10*3/uL (ref 1.4–7.0)
Neutrophils: 78 %
Platelets: 198 10*3/uL (ref 150–450)
RBC: 4.68 x10E6/uL (ref 3.77–5.28)
RDW: 12.6 % (ref 11.7–15.4)
WBC: 7.5 10*3/uL (ref 3.4–10.8)

## 2023-02-22 LAB — ANTINUCLEAR ANTIBODIES, IFA: ANA Titer 1: POSITIVE — AB

## 2023-02-22 LAB — C-REACTIVE PROTEIN: CRP: 7 mg/L (ref 0–10)

## 2023-02-22 LAB — TSH: TSH: 1.3 u[IU]/mL (ref 0.450–4.500)

## 2023-02-22 LAB — CK: Total CK: 26 U/L — ABNORMAL LOW (ref 32–182)

## 2023-02-22 LAB — FANA STAINING PATTERNS: Homogeneous Pattern: 1:80 {titer}

## 2023-02-22 LAB — SEDIMENTATION RATE: Sed Rate: 2 mm/h (ref 0–40)

## 2023-02-27 ENCOUNTER — Telehealth: Payer: Self-pay

## 2023-02-27 NOTE — Telephone Encounter (Signed)
-----   Message from Willeen Niece sent at 02/27/2023  8:36 AM EDT ----- Pts glucose is extremely high.  Does she take insulin for diabetes?  She needs to address this ASAP.  Does she have a PCP in [address]?  Liver function tests are slightly elevated.  CBC, renal function, ESR, CRP, CK, TSH are normal.  ANA is positive, but only 1:80.  It is possible the prednisone affected the value and I would like to repeat it when she is off of the prednisone.  Please order HbA1c. fasting blood glucose and lipid panel, hepatic panel, and repeat ANA to be drawn when off of Prednisone.  She needs to keep her f/up appointment to assess her rash.  - please call patient

## 2023-02-27 NOTE — Telephone Encounter (Signed)
Spoke to patient about labs that were reviewed with her last week.  I advised pt that Dr. Roseanne Reno would plan some additional labs at her f/u appointment and when she is off the prednisone.  Pt advised she had been off prednisone b/c she was unable to get the other prescription (pt was not given the full amount for taper with original script due to the way the prescription was written) and pt said the prednisone made her glucose levels go way up.  Pt had to end the call due to being on other line with her insurance.  I advised pt to keep f/u appt./sh

## 2023-03-07 ENCOUNTER — Encounter: Payer: Self-pay | Admitting: Dermatology

## 2023-03-07 ENCOUNTER — Ambulatory Visit: Payer: Medicare PPO | Admitting: Dermatology

## 2023-03-07 DIAGNOSIS — L308 Other specified dermatitis: Secondary | ICD-10-CM | POA: Diagnosis not present

## 2023-03-07 DIAGNOSIS — R21 Rash and other nonspecific skin eruption: Secondary | ICD-10-CM

## 2023-03-07 MED ORDER — BETAMETHASONE DIPROPIONATE 0.05 % EX LOTN: TOPICAL_LOTION | Freq: Two times a day (BID) | CUTANEOUS | 1 refills | Status: AC | PRN

## 2023-03-07 MED ORDER — BETAMETHASONE DIPROPIONATE 0.05 % EX CREA: TOPICAL_CREAM | Freq: Two times a day (BID) | CUTANEOUS | 2 refills | Status: AC | PRN

## 2023-03-07 NOTE — Patient Instructions (Addendum)
Treatment Plan: Start diprolene cream twice daily to more severe affected areas at body. Avoid applying to face, groin, and axilla. Use as directed. Long-term use can cause thinning of the skin.  Start diprolene lotion 1-2 times daily to scalp as needed for itch. Avoid applying to face, groin, and axilla. Use as directed. Long-term use can cause thinning of the skin.  Continue triamcinolone 0.1% twice daily to affected areas at body. Avoid applying to face, groin, and axilla. Use as directed. Long-term use can cause thinning of the skin.  Continue hydrocortisone 2.5% cream twice daily to face as needed.   Continue fluocinolone oil to scalp nightly. Cover with shower cap, leave on overnight and wash out in the morning.  Topical steroids (such as triamcinolone, fluocinolone, fluocinonide, mometasone, clobetasol, halobetasol, betamethasone, hydrocortisone) can cause thinning and lightening of the skin if they are used for too long in the same area. Your physician has selected the right strength medicine for your problem and area affected on the body. Please use your medication only as directed by your physician to prevent side effects.   Recommend OTC Zeasorb AF powder to body folds daily after shower.  It is often found in the athlete's foot section in the pharmacy.  Avoid using powders that contain cornstarch.   Due to recent changes in healthcare laws, you may see results of your pathology and/or laboratory studies on MyChart before the doctors have had a chance to review them. We understand that in some cases there may be results that are confusing or concerning to you. Please understand that not all results are received at the same time and often the doctors may need to interpret multiple results in order to provide you with the best plan of care or course of treatment. Therefore, we ask that you please give [place] 2 business days to thoroughly review all your results before contacting the office for  clarification. Should we see a critical lab result, you will be contacted sooner.   If You Need Anything After Your Visit  If you have any questions or concerns for your doctor, please call our main line at [phone] and press option 4 to reach your doctor's medical assistant. If no one answers, please leave a voicemail as directed and we will return your call as soon as possible. Messages left after 4 pm will be answered the following business day.   You may also send [place] a message via MyChart. We typically respond to MyChart messages within 1-2 business days.  For prescription refills, please ask your pharmacy to contact our office. Our fax number is [phone].  If you have an urgent issue when the clinic is closed that cannot wait until the next business day, you can page your doctor at the number below.    Please note that while we do our best to be available for urgent issues outside of office hours, we are not available 24/7.   If you have an urgent issue and are unable to reach [place], you may choose to seek medical care at your doctor's office, retail clinic, urgent care center, or emergency room.  If you have a medical emergency, please immediately call 911 or go to the emergency department.  Pager Numbers  - Dr. Gwen Pounds: [phone]  - Dr. Roseanne Reno: [phone]  - Dr. Katrinka Blazing: [phone]   In the event of inclement weather, please call our main line at [phone] for an update on the status of any delays or closures.  Dermatology Medication  Tips: Please keep the boxes that topical medications come in in order to help keep track of the instructions about where and how to use these. Pharmacies typically print the medication instructions only on the boxes and not directly on the medication tubes.   If your medication is too expensive, please contact our office at [phone] option 4 or send [place] a message through MyChart.   We are unable to tell what your co-pay for  medications will be in advance as this is different depending on your insurance coverage. However, we may be able to find a substitute medication at lower cost or fill out paperwork to get insurance to cover a needed medication.   If a prior authorization is required to get your medication covered by your insurance company, please allow [place] 1-2 business days to complete this process.  Drug prices often vary depending on where the prescription is filled and some pharmacies may offer cheaper prices.  The website www.goodrx.com contains coupons for medications through different pharmacies. The prices here do not account for what the cost may be with help from insurance (it may be cheaper with your insurance), but the website can give you the price if you did not use any insurance.  - You can print the associated coupon and take it with your prescription to the pharmacy.  - You may also stop by our office during regular business hours and pick up a GoodRx coupon card.  - If you need your prescription sent electronically to a different pharmacy, notify our office through [facility] or by phone at [phone] option 4.

## 2023-03-07 NOTE — Progress Notes (Signed)
Follow-Up Visit   Subjective  Cheyenne Welch is a 62 y.o. 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 Patient was started on Dupixent 10/1 for presumed eczema flare and was given samples to self inject at week 2, but pt doesn't remember doing this. She was also given a prednisone taper which patient advises was filled incorrectly by the pharmacy and she stopped early. Patient advises that she can not take prednisone because she had too many side effects and it made her blood sugar get too high. She has started insulin injections for her diabetes.   She is using TMC 0.1% ointment and HC 2.5% ointment. She is using fluocinolone oil and ketoconazole shampoo at scalp.   Patient currently taking fluconazole 150 mg for yeast. The only other newer medication she has started is oxybutynin.   The patient has spots, moles and lesions to be evaluated, some may be new or changing and the patient may have concern these could be cancer.   The following portions of the chart were reviewed this encounter and updated as appropriate: medications, allergies, medical history  Review of Systems:  No other skin or systemic complaints except as noted in HPI or Assessment and Plan.  Objective  Well appearing patient in no apparent distress; mood and affect are within normal limits.   A focused examination was performed of the following areas: Arms, shoulders, back, abdomen, chest, face  Relevant exam findings are noted in the Assessment and Plan.    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 Condition is bothersome/symptomatic for patient. Currently flared.    Patient advised could be related to SCLE/SLE vs dermatomyositis vs drug reaction. Going through med list the only med with reports of causing SCLE is Losartan.  Reviewed labs with patient, all normal, except  extremely high Glucose (pt diabetic on prednisone when checked), mildly elevated LFTs, and ANA low positive 1:80.  CK  and ESR/CRP was normal  Exam: Pink scaly patches on arms, shoulders, face, scalp, pt still very itchy, DIPs with enlargement and some with pink papules Diffuse bright erythematous scaly patches at chest and back  Treatment Plan: Pt not taking Prednisone due to side effects or Dupixent Pt started Fluconazole for yeast infection. Pt started insulin for Diabetes Repeat ANA with IFA ([place], anti-histone), LFTS, HbA1c  Start diprolene cream twice daily to more severe affected areas at body. Avoid applying to face, groin, and axilla. Use as directed. Long-term use can cause thinning of the skin. Start diprolene lotion 1-2 times daily to scalp as needed for itch. (Clobetasol sol not covered) Continue TMC 0.1% ointment twice daily to affected areas at body. Continue HC 2.5% ointment twice daily to face/body folds as needed.  Continue fluocinolone oil to scalp 3 nights/wk. Cover with shower cap, leave on overnight and wash out in the morning.  Topical steroids (such as triamcinolone, fluocinolone, fluocinonide, mometasone, clobetasol, halobetasol, betamethasone, hydrocortisone) can cause thinning and lightening of the skin if they are used for too long in the same area. Your physician has selected the right strength medicine for your problem and area affected on the body. Please use your medication only as directed by your physician to prevent side effects.   Patient advises "dimples" at her fingers just started about 1 month ago. Probable digital mucous cysts in setting of arthritis. Patient has not been seen by rheum  for arthritis. Will refer to rheumatology in [place], since closer to [facility]. for evaluation of  connective tissue disease. Dr. Donia Pounds, MD  Rash and other nonspecific skin eruption  Related Procedures Hemoglobin A1c Hepatic Function Panel ANA  Profile 12 (RDL)    Return in about 2 months (around 05/07/2023) for Dermatitis.  Anise Salvo, RMA, am acting as scribe for Willeen Niece, MD .   Documentation: I have reviewed the above documentation for accuracy and completeness, and I agree with the above.  Willeen Niece, MD

## 2023-03-08 ENCOUNTER — Other Ambulatory Visit: Payer: Self-pay

## 2023-03-08 DIAGNOSIS — L308 Other specified dermatitis: Secondary | ICD-10-CM

## 2023-03-09 ENCOUNTER — Other Ambulatory Visit: Payer: Self-pay

## 2023-03-09 DIAGNOSIS — R21 Rash and other nonspecific skin eruption: Secondary | ICD-10-CM

## 2023-03-16 ENCOUNTER — Telehealth: Payer: Self-pay

## 2023-03-16 NOTE — Telephone Encounter (Signed)
ERROR

## 2023-03-17 LAB — ANA 12 PROFILE, POSITIVE ANA
Anti-Cardiolipin Ab, IgA (RDL): <12 [APL'U]/mL (ref <12)
Anti-Cardiolipin Ab, IgG (RDL): <15 [GPL'U]/mL (ref <15)
Anti-Cardiolipin Ab, IgM (RDL): <13 [MPL'U]/mL (ref <13)
Anti-Centromere Ab (RDL): <1:40 {titer}
Anti-La (SS-B) Ab (RDL): <20 U (ref <20)
Anti-Ro (SS-A) Ab (RDL): <20 U (ref <20)
Anti-Scl-70 Ab (RDL): <20 U (ref <20)
Anti-Sm Ab (RDL): <20 U (ref <20)
Anti-TPO Ab (RDL): <9 [IU]/mL (ref <9.0)
Anti-U1 RNP Ab (RDL): <20 U (ref <20)
Anti-dsDNA Ab by Farr(RDL): <8 [IU]/mL (ref <8.0)
C3 Complement (RDL): 186 mg/dL — ABNORMAL HIGH (ref 82–167)
C4 Complement (RDL): 27 mg/dL (ref 14–44)
Homogeneous Pattern: 1:640 {titer} — ABNORMAL HIGH
Nuclear Dot Pattern: 1:640 {titer} — ABNORMAL HIGH

## 2023-03-17 LAB — HEPATIC FUNCTION PANEL
ALT: 47 [IU]/L — ABNORMAL HIGH (ref 0–32)
AST: 49 [IU]/L — ABNORMAL HIGH (ref 0–40)
Albumin: 4.2 g/dL (ref 3.9–4.9)
Alkaline Phosphatase: 115 [IU]/L (ref 44–121)
Bilirubin Total: 1 mg/dL (ref 0.0–1.2)
Bilirubin, Direct: 0.31 mg/dL (ref 0.00–0.40)
Total Protein: 6.7 g/dL (ref 6.0–8.5)

## 2023-03-17 LAB — HEMOGLOBIN A1C
Est. average glucose Bld gHb Est-mCnc: 289 mg/dL
Hgb A1c MFr Bld: 11.7 % — ABNORMAL HIGH (ref 4.8–5.6)

## 2023-03-17 LAB — ANA PROFILE 12 (RDL): Anti-Nuclear Ab by IFA (RDL): POSITIVE — AB

## 2023-03-20 ENCOUNTER — Telehealth: Payer: Self-pay

## 2023-03-20 NOTE — Telephone Encounter (Signed)
Patient advised of labs.  She states she is getting worse, offered patient to be seen Wednesday morning - will see if her transportation is available.  Faxed through [facility] labs to PCP. aw

## 2023-03-20 NOTE — Telephone Encounter (Signed)
-----   Message from Willeen Niece sent at 03/20/2023 11:42 AM EST ----- Pt has a positive ANA 1:640, homogenous and nuclear dot pattern c/w autoimmune disease (Lupus) and slightly elevated C3 Complement levels c/w inflammation.  Liver enzymes still mildly elevated but bilirubin is normal,  Anti-Ro (SSA) is negative, making SCLE less likely- although if she is able to stop the Losartan (per PCP), to see if she improves, she could do that.    We submitted a referral to a rheumatologist in [place] to address above lab abnormalities as well as skin biopsies suspicious for SLE or other autoimmune condition.   Please f/up and see if pt has scheduled that appointment yet with Dr. Octaviano Glow.  HbA1c is very high showing uncontrolled diabetes.  Pt not able to tolerate oral prednisone due to uncontrolled DM.  Pt is on insulin. - please forward labs to her PCP who treats her DM.      - please call patient about above and check to see how her itchy rash is doing with topical treatment

## 2023-03-20 NOTE — Telephone Encounter (Signed)
Left pt msg to call for lab results/sh 

## 2023-03-21 ENCOUNTER — Telehealth: Payer: Self-pay

## 2023-03-21 NOTE — Telephone Encounter (Signed)
Patient's office visit notes, pathology results and lab results faxed to Dr. Octaviano Glow at [facility]. Fax confirmed. Butch Penny., RMA

## 2023-03-22 ENCOUNTER — Ambulatory Visit: Payer: Medicare PPO | Admitting: Dermatology

## 2023-05-16 ENCOUNTER — Ambulatory Visit: Payer: Medicare PPO | Admitting: Dermatology
